# Patient Record
Sex: Female | Born: 1955 | Race: White | Hispanic: No | Marital: Married | Smoking: Never smoker
Health system: Southern US, Community
[De-identification: ages and names within clinical notes are randomized; demographics above are authoritative.]

## PROBLEM LIST (undated history)

## (undated) DIAGNOSIS — D219 Benign neoplasm of connective and other soft tissue, unspecified: Secondary | ICD-10-CM

## (undated) DIAGNOSIS — E079 Disorder of thyroid, unspecified: Secondary | ICD-10-CM

## (undated) HISTORY — DX: Benign neoplasm of connective and other soft tissue, unspecified: D21.9

## (undated) HISTORY — DX: Disorder of thyroid, unspecified: E07.9

---

## 1987-03-23 HISTORY — PX: BREAST SURGERY: SHX581

## 1987-03-23 HISTORY — PX: BREAST EXCISIONAL BIOPSY: SUR124

## 1997-03-22 HISTORY — PX: THYROIDECTOMY, PARTIAL: SHX18

## 1997-09-16 ENCOUNTER — Other Ambulatory Visit: Admission: RE | Admit: 1997-09-16 | Discharge: 1997-09-16 | Payer: Self-pay | Admitting: Obstetrics and Gynecology

## 1997-10-04 ENCOUNTER — Other Ambulatory Visit: Admission: RE | Admit: 1997-10-04 | Discharge: 1997-10-04 | Payer: Self-pay | Admitting: Obstetrics and Gynecology

## 1999-02-20 HISTORY — PX: APPENDECTOMY: SHX54

## 1999-02-25 ENCOUNTER — Encounter (INDEPENDENT_AMBULATORY_CARE_PROVIDER_SITE_OTHER): Payer: Self-pay | Admitting: Specialist

## 1999-02-25 ENCOUNTER — Inpatient Hospital Stay (HOSPITAL_COMMUNITY): Admission: EM | Admit: 1999-02-25 | Discharge: 1999-02-26 | Payer: Self-pay | Admitting: Emergency Medicine

## 1999-10-21 ENCOUNTER — Other Ambulatory Visit: Admission: RE | Admit: 1999-10-21 | Discharge: 1999-10-21 | Payer: Self-pay | Admitting: Obstetrics and Gynecology

## 1999-10-27 ENCOUNTER — Encounter: Payer: Self-pay | Admitting: Endocrinology

## 1999-10-27 ENCOUNTER — Encounter: Admission: RE | Admit: 1999-10-27 | Discharge: 1999-10-27 | Payer: Self-pay | Admitting: Endocrinology

## 2000-06-16 ENCOUNTER — Encounter: Payer: Self-pay | Admitting: Endocrinology

## 2000-06-16 ENCOUNTER — Encounter: Admission: RE | Admit: 2000-06-16 | Discharge: 2000-06-16 | Payer: Self-pay | Admitting: Endocrinology

## 2000-06-22 ENCOUNTER — Encounter: Payer: Self-pay | Admitting: Obstetrics and Gynecology

## 2000-06-22 ENCOUNTER — Encounter: Admission: RE | Admit: 2000-06-22 | Discharge: 2000-06-22 | Payer: Self-pay | Admitting: Obstetrics and Gynecology

## 2000-11-09 ENCOUNTER — Other Ambulatory Visit: Admission: RE | Admit: 2000-11-09 | Discharge: 2000-11-09 | Payer: Self-pay | Admitting: Obstetrics and Gynecology

## 2001-08-31 ENCOUNTER — Encounter: Payer: Self-pay | Admitting: Endocrinology

## 2001-08-31 ENCOUNTER — Encounter: Admission: RE | Admit: 2001-08-31 | Discharge: 2001-08-31 | Payer: Self-pay | Admitting: Endocrinology

## 2001-10-27 ENCOUNTER — Encounter: Payer: Self-pay | Admitting: Obstetrics and Gynecology

## 2001-10-27 ENCOUNTER — Encounter: Admission: RE | Admit: 2001-10-27 | Discharge: 2001-10-27 | Payer: Self-pay | Admitting: Obstetrics and Gynecology

## 2001-11-22 ENCOUNTER — Other Ambulatory Visit: Admission: RE | Admit: 2001-11-22 | Discharge: 2001-11-22 | Payer: Self-pay | Admitting: Obstetrics and Gynecology

## 2002-02-05 ENCOUNTER — Encounter: Payer: Self-pay | Admitting: Endocrinology

## 2002-02-05 ENCOUNTER — Encounter: Admission: RE | Admit: 2002-02-05 | Discharge: 2002-02-05 | Payer: Self-pay | Admitting: Endocrinology

## 2002-10-01 ENCOUNTER — Encounter: Payer: Self-pay | Admitting: Endocrinology

## 2002-10-01 ENCOUNTER — Encounter: Admission: RE | Admit: 2002-10-01 | Discharge: 2002-10-01 | Payer: Self-pay | Admitting: Endocrinology

## 2002-11-05 ENCOUNTER — Encounter: Admission: RE | Admit: 2002-11-05 | Discharge: 2002-11-05 | Payer: Self-pay | Admitting: Obstetrics and Gynecology

## 2002-11-05 ENCOUNTER — Encounter: Payer: Self-pay | Admitting: Obstetrics and Gynecology

## 2003-12-17 ENCOUNTER — Encounter: Admission: RE | Admit: 2003-12-17 | Discharge: 2003-12-17 | Payer: Self-pay | Admitting: Obstetrics and Gynecology

## 2003-12-17 ENCOUNTER — Encounter: Admission: RE | Admit: 2003-12-17 | Discharge: 2003-12-17 | Payer: Self-pay | Admitting: Endocrinology

## 2004-03-18 ENCOUNTER — Other Ambulatory Visit: Admission: RE | Admit: 2004-03-18 | Discharge: 2004-03-18 | Payer: Self-pay | Admitting: Obstetrics and Gynecology

## 2005-03-24 ENCOUNTER — Encounter: Admission: RE | Admit: 2005-03-24 | Discharge: 2005-03-24 | Payer: Self-pay | Admitting: Endocrinology

## 2005-07-01 ENCOUNTER — Encounter: Admission: RE | Admit: 2005-07-01 | Discharge: 2005-07-01 | Payer: Self-pay | Admitting: Obstetrics and Gynecology

## 2006-04-13 ENCOUNTER — Encounter: Admission: RE | Admit: 2006-04-13 | Discharge: 2006-04-13 | Payer: Self-pay | Admitting: Endocrinology

## 2006-04-21 ENCOUNTER — Other Ambulatory Visit: Admission: RE | Admit: 2006-04-21 | Discharge: 2006-04-21 | Payer: Self-pay | Admitting: Obstetrics and Gynecology

## 2007-03-30 ENCOUNTER — Encounter: Admission: RE | Admit: 2007-03-30 | Discharge: 2007-03-30 | Payer: Self-pay | Admitting: Endocrinology

## 2007-04-04 ENCOUNTER — Encounter: Admission: RE | Admit: 2007-04-04 | Discharge: 2007-04-04 | Payer: Self-pay | Admitting: Obstetrics and Gynecology

## 2008-04-05 ENCOUNTER — Encounter: Admission: RE | Admit: 2008-04-05 | Discharge: 2008-04-05 | Payer: Self-pay | Admitting: Endocrinology

## 2008-10-22 ENCOUNTER — Encounter: Admission: RE | Admit: 2008-10-22 | Discharge: 2008-10-22 | Payer: Self-pay | Admitting: Obstetrics and Gynecology

## 2009-04-03 ENCOUNTER — Encounter: Admission: RE | Admit: 2009-04-03 | Discharge: 2009-04-03 | Payer: Self-pay | Admitting: Endocrinology

## 2009-10-08 HISTORY — PX: ENDOMETRIAL BIOPSY: SHX622

## 2009-10-08 LAB — HM PAP SMEAR

## 2010-04-07 ENCOUNTER — Encounter
Admission: RE | Admit: 2010-04-07 | Discharge: 2010-04-07 | Payer: Self-pay | Source: Home / Self Care | Attending: Endocrinology | Admitting: Endocrinology

## 2011-04-05 ENCOUNTER — Other Ambulatory Visit: Payer: Self-pay | Admitting: Endocrinology

## 2011-04-05 DIAGNOSIS — E042 Nontoxic multinodular goiter: Secondary | ICD-10-CM

## 2011-04-07 ENCOUNTER — Ambulatory Visit
Admission: RE | Admit: 2011-04-07 | Discharge: 2011-04-07 | Disposition: A | Discharge status: Home / Self Care | Payer: PRIVATE HEALTH INSURANCE | Source: Ambulatory Visit | Attending: Endocrinology | Admitting: Endocrinology

## 2011-04-07 DIAGNOSIS — E042 Nontoxic multinodular goiter: Secondary | ICD-10-CM

## 2012-01-10 ENCOUNTER — Other Ambulatory Visit: Payer: Self-pay | Admitting: Endocrinology

## 2012-01-10 ENCOUNTER — Other Ambulatory Visit: Payer: Self-pay | Admitting: Obstetrics and Gynecology

## 2012-01-10 DIAGNOSIS — Z1231 Encounter for screening mammogram for malignant neoplasm of breast: Secondary | ICD-10-CM

## 2012-01-10 DIAGNOSIS — E042 Nontoxic multinodular goiter: Secondary | ICD-10-CM

## 2012-01-11 ENCOUNTER — Ambulatory Visit
Admission: RE | Admit: 2012-01-11 | Discharge: 2012-01-11 | Disposition: A | Discharge status: Home / Self Care | Payer: PRIVATE HEALTH INSURANCE | Source: Ambulatory Visit | Attending: Endocrinology | Admitting: Endocrinology

## 2012-01-11 ENCOUNTER — Ambulatory Visit
Admission: RE | Admit: 2012-01-11 | Discharge: 2012-01-11 | Disposition: A | Discharge status: Home / Self Care | Payer: PRIVATE HEALTH INSURANCE | Source: Ambulatory Visit | Attending: Obstetrics and Gynecology | Admitting: Obstetrics and Gynecology

## 2012-01-11 DIAGNOSIS — E042 Nontoxic multinodular goiter: Secondary | ICD-10-CM

## 2012-01-11 DIAGNOSIS — Z1231 Encounter for screening mammogram for malignant neoplasm of breast: Secondary | ICD-10-CM

## 2012-08-24 ENCOUNTER — Other Ambulatory Visit: Payer: Self-pay | Admitting: Endocrinology

## 2012-08-24 DIAGNOSIS — E042 Nontoxic multinodular goiter: Secondary | ICD-10-CM

## 2012-09-19 ENCOUNTER — Ambulatory Visit
Admission: RE | Admit: 2012-09-19 | Discharge: 2012-09-19 | Disposition: A | Discharge status: Home / Self Care | Payer: PRIVATE HEALTH INSURANCE | Source: Ambulatory Visit | Attending: Endocrinology | Admitting: Endocrinology

## 2012-09-19 DIAGNOSIS — E042 Nontoxic multinodular goiter: Secondary | ICD-10-CM

## 2012-10-10 ENCOUNTER — Other Ambulatory Visit: Payer: Self-pay | Admitting: Gastroenterology

## 2012-10-10 DIAGNOSIS — Z1211 Encounter for screening for malignant neoplasm of colon: Secondary | ICD-10-CM

## 2012-12-25 ENCOUNTER — Ambulatory Visit
Admission: RE | Admit: 2012-12-25 | Discharge: 2012-12-25 | Disposition: A | Discharge status: Home / Self Care | Payer: PRIVATE HEALTH INSURANCE | Source: Ambulatory Visit | Attending: Gastroenterology | Admitting: Gastroenterology

## 2012-12-25 DIAGNOSIS — Z1211 Encounter for screening for malignant neoplasm of colon: Secondary | ICD-10-CM

## 2013-03-06 ENCOUNTER — Other Ambulatory Visit: Payer: Self-pay | Admitting: Endocrinology

## 2013-03-06 DIAGNOSIS — Z1231 Encounter for screening mammogram for malignant neoplasm of breast: Secondary | ICD-10-CM

## 2013-03-12 ENCOUNTER — Other Ambulatory Visit: Payer: Self-pay | Admitting: Endocrinology

## 2013-03-12 DIAGNOSIS — E042 Nontoxic multinodular goiter: Secondary | ICD-10-CM

## 2013-03-20 ENCOUNTER — Ambulatory Visit
Admission: RE | Admit: 2013-03-20 | Discharge: 2013-03-20 | Disposition: A | Discharge status: Home / Self Care | Payer: PRIVATE HEALTH INSURANCE | Source: Ambulatory Visit | Attending: Endocrinology | Admitting: Endocrinology

## 2013-03-20 DIAGNOSIS — E042 Nontoxic multinodular goiter: Secondary | ICD-10-CM

## 2013-03-20 DIAGNOSIS — Z1231 Encounter for screening mammogram for malignant neoplasm of breast: Secondary | ICD-10-CM

## 2013-03-20 LAB — HM MAMMOGRAPHY

## 2013-03-28 ENCOUNTER — Ambulatory Visit (INDEPENDENT_AMBULATORY_CARE_PROVIDER_SITE_OTHER): Payer: PRIVATE HEALTH INSURANCE | Admitting: Obstetrics and Gynecology

## 2013-03-28 ENCOUNTER — Encounter: Payer: Self-pay | Admitting: Obstetrics and Gynecology

## 2013-03-28 ENCOUNTER — Ambulatory Visit: Payer: Self-pay | Admitting: Obstetrics and Gynecology

## 2013-03-28 VITALS — BP 122/80 | HR 66 | Resp 16 | Ht 64.5 in | Wt 122.0 lb

## 2013-03-28 DIAGNOSIS — Z0000 Encounter for general adult medical examination without abnormal findings: Secondary | ICD-10-CM

## 2013-03-28 DIAGNOSIS — Z01419 Encounter for gynecological examination (general) (routine) without abnormal findings: Secondary | ICD-10-CM

## 2013-03-28 NOTE — Patient Instructions (Signed)

## 2013-03-28 NOTE — Progress Notes (Signed)
Patient ID: Becky Anderson, female   DOB: 1955/07/26, 58 y.o.   MRN: [id number] GYNECOLOGY VISIT  PCP:   Reynold Bowen, MD  Referring provider:   HPI: 58 y.o.   Married  Caucasian  female   G2P2002 with Patient's last menstrual period was 08/21/2010.   here for   AEX. History of gestational diabetes.  Does periodic glucose testing.  Followed by Dr. Forde Dandy for thyroid nodules.  Has periodic thyroid testing and ultrasound. Will do blood work with Dr. Forde Dandy.   Hgb:    PCP.  Lab draw unsuccessful today.  Will do with PCP.  Urine:  Neg  GYNECOLOGIC HISTORY: Patient's last menstrual period was 08/21/2010. Sexually active:  yes Partner preference: female Contraception:   postmenopausal Menopausal hormone therapy: none DES exposure:   no Blood transfusions:  no  Sexually transmitted diseases:   no GYN Procedures:  Endometrial biopsy 2012 - benign endocervical polyp Mammogram:   03-20-13 wnl:[facility]              Pap:   10-08-09 wnl History of abnormal pap smear:  no   OB History   Grav Para Term Preterm Abortions TAB SAB Ect Mult Living   2 2 2       2        LIFESTYLE: Exercise:   Some walking, some stretching          Tobacco:     no Alcohol:       no Drug use:    no  OTHER HEALTH MAINTENANCE: Tetanus/TDap:     03/2006 Gardisil:                n/a Influenza:               never Zostavax:               n/a  Bone density:        none Colonoscopy:        2014 wnl with Dr. Laurence Spates.  Did a virtual colonoscopy.   Next colonoscopy due 2019.  Cholesterol check:    PCP--wnl   Family History  Problem Relation Age of Onset  . Hypertension Mother   . Cancer Mother 64    Rectal cancer  . Stroke Mother   . Atrial fibrillation Father   . Hypertension Father   . Breast cancer Maternal Aunt   . Stroke Paternal Grandfather     There are no active problems to display for this patient.  Past Medical History  Diagnosis Date  . Fibroid   . Thyroid disease      thyroidectomy-partial    Past Surgical History  Procedure Laterality Date  . Appendectomy  02/1999  . Breast surgery  1989    rt. breast bx--benign  . Thyroidectomy, partial  1999  . Endometrial biopsy  10-08-2009    -benign proliferative endometrium    ALLERGIES: Ceclor and Sulfa antibiotics  Current Outpatient Prescriptions  Medication Sig Dispense Refill  . levothyroxine (SYNTHROID, LEVOTHROID) 75 MCG tablet Take 75 mcg by mouth daily before breakfast.       No current facility-administered medications for this visit.     ROS:  Pertinent items are noted in HPI.  SOCIAL HISTORY:  Engineer, maintenance (IT).  Two boys - 5 and 46 years old. Husband is radiologist.  Aunt died from [place].  Mother wiht rectal cancer.   Father with ruptured appendix.    PHYSICAL EXAMINATION:    BP 122/80  Pulse  66  Resp 16  Ht 5' 4.5" (1.638 m)  Wt 122 lb (55.339 kg)  BMI 20.63 kg/m2  LMP 08/21/2010   Wt Readings from Last 3 Encounters:  03/28/13 122 lb (55.339 kg)     Ht Readings from Last 3 Encounters:  03/28/13 5' 4.5" (1.638 m)    General appearance: alert, cooperative and appears stated age Head: Normocephalic, without obvious abnormality, atraumatic Neck: no adenopathy, supple, symmetrical, trachea midline and thyroid not enlarged, symmetric, no tenderness/mass/nodules Lungs: clear to auscultation bilaterally Breasts: Inspection negative, No nipple retraction or dimpling, No nipple discharge or bleeding, No axillary or supraclavicular adenopathy, Normal to palpation without dominant masses Heart: regular rate and rhythm Abdomen: soft, non-tender; no masses,  no organomegaly Extremities: extremities normal, atraumatic, no cyanosis or edema Skin: Skin color, texture, turgor normal. No rashes or lesions Lymph nodes: Cervical, supraclavicular, and axillary nodes normal. No abnormal inguinal nodes palpated Neurologic: Grossly normal  Pelvic: External genitalia:  no lesions              Urethra:   normal appearing urethra with no masses, tenderness or lesions              Bartholins and Skenes: normal                 Vagina: normal appearing vagina with normal color and discharge, no lesions              Cervix: normal appearance              Pap and high risk HPV testing done: yes.            Bimanual Exam:  Uterus:  uterus is normal size, shape, consistency and nontender                                      Adnexa: normal adnexa in size, nontender and no masses                                      Rectovaginal: Confirms                                      Anus:  normal sphincter tone, no lesions  ASSESSMENT  Normal gynecologic exam. History of thyroid nodules. History of gestational diabetes.  PLAN  Mammogram yearly. Pap smear and high risk HPV testing performed. Will do labs with PCP.  Return annually or prn   An After Visit Summary was printed and given to the patient.

## 2013-03-30 LAB — IPS PAP TEST WITH HPV

## 2013-11-15 ENCOUNTER — Encounter: Payer: Self-pay | Admitting: Obstetrics and Gynecology

## 2014-01-21 ENCOUNTER — Encounter: Payer: Self-pay | Admitting: Obstetrics and Gynecology

## 2014-04-01 ENCOUNTER — Ambulatory Visit: Payer: PRIVATE HEALTH INSURANCE | Admitting: Obstetrics and Gynecology

## 2014-04-03 ENCOUNTER — Ambulatory Visit (INDEPENDENT_AMBULATORY_CARE_PROVIDER_SITE_OTHER): Payer: PRIVATE HEALTH INSURANCE | Admitting: Obstetrics and Gynecology

## 2014-04-03 ENCOUNTER — Encounter: Payer: Self-pay | Admitting: Obstetrics and Gynecology

## 2014-04-03 VITALS — BP 100/70 | HR 84 | Resp 14 | Ht 64.5 in | Wt 122.6 lb

## 2014-04-03 DIAGNOSIS — R319 Hematuria, unspecified: Secondary | ICD-10-CM

## 2014-04-03 DIAGNOSIS — Z01419 Encounter for gynecological examination (general) (routine) without abnormal findings: Secondary | ICD-10-CM

## 2014-04-03 DIAGNOSIS — Z0000 Encounter for general adult medical examination without abnormal findings: Secondary | ICD-10-CM

## 2014-04-03 LAB — POCT URINALYSIS DIPSTICK
BILIRUBIN UA: NEGATIVE
GLUCOSE UA: NEGATIVE
KETONES UA: NEGATIVE
LEUKOCYTES UA: NEGATIVE
Nitrite, UA: NEGATIVE
PH UA: 5
Protein, UA: NEGATIVE
Urobilinogen, UA: NEGATIVE

## 2014-04-03 NOTE — Progress Notes (Signed)
Patient ID: Becky Anderson, female   DOB: 10-03-1955, 59 y.o.   MRN: [id number] 59 y.o. [id number] MarriedCaucasianF here for annual exam.   PCP:  Reynold Bowen, MD  Trying to help her parents.  They live independently.  Mother treated for colon cancer.  Father with ruptured appendix and recurrent infections.   Sees Dr. Forde Dandy for follow up thyroid nodules and partial thyroidectomy.  History of gestational diabetes.   Friend and neighbor passed from liver cancer.   Patient's last menstrual period was 08/21/2010.          Sexually active: Yes.  female partner  The current method of family planning is status post menopause.  Exercising: Yes.    walking. Smoker:  no  Health Maintenance: Pap:  03-28-13 wnl:neg HR HPV History of abnormal Pap:  no MMG:  03-20-13 extremely dense/wnl:[facility] Colonoscopy:  12/2012 normal with Dr. Laurence Spates.  Next colonoscopy due 12/2017. BMD:    --- TDaP:  03/2006. Screening Labs:   Hb today: PCP, Urine today: trace RBC's - some mild dysuria.    reports that she has never smoked. She does not have any smokeless tobacco history on file. She reports that she does not drink alcohol or use illicit drugs.  Past Medical History  Diagnosis Date  . Fibroid   . Thyroid disease     thyroidectomy-partial    Past Surgical History  Procedure Laterality Date  . Appendectomy  02/1999  . Breast surgery  1989    rt. breast bx--benign  . Thyroidectomy, partial  1999  . Endometrial biopsy  10-08-2009    -benign proliferative endometrium    Current Outpatient Prescriptions  Medication Sig Dispense Refill  . levothyroxine (SYNTHROID, LEVOTHROID) 75 MCG tablet Take 75 mcg by mouth daily before breakfast.     No current facility-administered medications for this visit.    Family History  Problem Relation Age of Onset  . Hypertension Mother   . Cancer Mother 33    Rectal cancer  . Stroke Mother   . Atrial fibrillation Father   . Hypertension Father   .  Breast cancer Maternal Aunt   . Stroke Paternal Grandfather     ROS:  Pertinent items are noted in HPI.  Otherwise, a comprehensive ROS was negative.  Exam:   BP 100/70 mmHg  Pulse 84  Resp 14  Ht 5' 4.5" (1.638 m)  Wt 122 lb 9.6 oz (55.611 kg)  BMI 20.73 kg/m2  LMP 08/21/2010     Height: 5' 4.5" (163.8 cm)  Ht Readings from Last 3 Encounters:  04/03/14 5' 4.5" (1.638 m)  03/28/13 5' 4.5" (1.638 m)    General appearance: alert, cooperative and appears stated age Head: Normocephalic, without obvious abnormality, atraumatic Neck: no adenopathy, supple, symmetrical, trachea midline and thyroid normal to inspection and palpation Lungs: clear to auscultation bilaterally Breasts: normal appearance, no masses or tenderness, Inspection negative, No nipple retraction or dimpling, No nipple discharge or bleeding, No axillary or supraclavicular adenopathy Heart: regular rate and rhythm Abdomen: soft, non-tender; bowel sounds normal; no masses,  no organomegaly Extremities: extremities normal, atraumatic, no cyanosis or edema Skin: Skin color, texture, turgor normal. No rashes or lesions Lymph nodes: Cervical, supraclavicular, and axillary nodes normal. No abnormal inguinal nodes palpated Neurologic: Grossly normal   Pelvic: External genitalia:  no lesions              Urethra:  normal appearing urethra with no masses, tenderness or lesions  Bartholins and Skenes: normal                 Vagina: normal appearing vagina with normal color and discharge, no lesions. Minimal cystocele.               Cervix: no lesions              Pap taken: No. Bimanual Exam:  Uterus:  normal size, contour, position, consistency, mobility, non-tender              Adnexa: normal adnexa and no mass, fullness, tenderness               Rectovaginal: Confirms               Anus:  normal sphincter tone, no lesions  Chaperone was present for exam.  A:  Well Woman with normal exam Status post  partial thyroidectomy.  Hx of gestational diabetes.  Minimal cystocele. Microscopic hematuria.   P:   Mammogram due.  Patient will schedule 3D pap smear not indicated. Labs with Dr. Forde Dandy.  Encouraged increased physical activity.  Return for increased pelvic organ prolapse or urinary incontinence.  return annually or prn

## 2014-04-03 NOTE — Addendum Note (Signed)
Addended by: Charmayne Sheer on: 04/03/2014 01:59 PM   Modules accepted: Orders

## 2014-04-03 NOTE — Patient Instructions (Signed)

## 2014-04-04 LAB — URINALYSIS, MICROSCOPIC ONLY
Bacteria, UA: NONE SEEN
CASTS: NONE SEEN
CRYSTALS: NONE SEEN
SQUAMOUS EPITHELIAL / LPF: NONE SEEN

## 2014-04-04 LAB — URINE CULTURE
Colony Count: NO GROWTH
Organism ID, Bacteria: NO GROWTH

## 2014-04-30 ENCOUNTER — Other Ambulatory Visit: Payer: Self-pay | Admitting: Endocrinology

## 2014-04-30 DIAGNOSIS — Z8639 Personal history of other endocrine, nutritional and metabolic disease: Secondary | ICD-10-CM

## 2014-05-03 ENCOUNTER — Ambulatory Visit
Admission: RE | Admit: 2014-05-03 | Discharge: 2014-05-03 | Disposition: A | Discharge status: Home / Self Care | Payer: PRIVATE HEALTH INSURANCE | Source: Ambulatory Visit | Attending: Endocrinology | Admitting: Endocrinology

## 2014-05-03 DIAGNOSIS — Z8639 Personal history of other endocrine, nutritional and metabolic disease: Secondary | ICD-10-CM

## 2015-04-11 ENCOUNTER — Encounter: Payer: Self-pay | Admitting: Obstetrics and Gynecology

## 2015-04-11 ENCOUNTER — Ambulatory Visit (INDEPENDENT_AMBULATORY_CARE_PROVIDER_SITE_OTHER): Payer: PRIVATE HEALTH INSURANCE | Admitting: Obstetrics and Gynecology

## 2015-04-11 VITALS — BP 140/76 | HR 76 | Resp 16 | Ht 64.5 in | Wt 125.4 lb

## 2015-04-11 DIAGNOSIS — Z01419 Encounter for gynecological examination (general) (routine) without abnormal findings: Secondary | ICD-10-CM | POA: Diagnosis not present

## 2015-04-11 DIAGNOSIS — N812 Incomplete uterovaginal prolapse: Secondary | ICD-10-CM

## 2015-04-11 DIAGNOSIS — Z0000 Encounter for general adult medical examination without abnormal findings: Secondary | ICD-10-CM

## 2015-04-11 DIAGNOSIS — Z1211 Encounter for screening for malignant neoplasm of colon: Secondary | ICD-10-CM | POA: Diagnosis not present

## 2015-04-11 LAB — POCT URINALYSIS DIPSTICK
Bilirubin, UA: NEGATIVE
Glucose, UA: NEGATIVE
Ketones, UA: NEGATIVE
Leukocytes, UA: NEGATIVE
NITRITE UA: NEGATIVE
PROTEIN UA: NEGATIVE
RBC UA: NEGATIVE
UROBILINOGEN UA: NEGATIVE
pH, UA: 5

## 2015-04-11 NOTE — Patient Instructions (Signed)

## 2015-04-11 NOTE — Progress Notes (Signed)
Patient ID: Becky Anderson, female   DOB: Jul 10, 1955, 60 y.o.   MRN: [id number] 60 y.o. G34P2002 Married Caucasian female here for annual exam.    No real change in her prolapse.  Voiding well and using fiber to help with bowel movements.  Younger son started college.  Is a Engineer, maintenance (IT).  Psychologist, occupational for [employer] for [employer]. Husband is Dr. Kalman Shan.  PCP:  Reynold Bowen, MD   Patient's last menstrual period was 08/21/2010.          Sexually active: Yes.   female The current method of family planning is post menopausal status.    Exercising: No.   Smoker:  no  Health Maintenance: Pap:  03-28-13 Neg:Neg HR HPV History of abnormal Pap:  no MMG:   03-23-13 Density Cat.D/Neg/BiRads1:[facility] Colonoscopy:  12/2012 normal with Dr. Alferd Apa [facility]);next due 12/2017 due to family hx colon cancer in mom. BMD:   n/a  Result  n/a TDaP:  Jan. 2008 Screening Labs:  Hb today: PCP, Urine today: Neg   reports that she has never smoked. She does not have any smokeless tobacco history on file. She reports that she does not drink alcohol or use illicit drugs.  Past Medical History  Diagnosis Date  . Fibroid   . Thyroid disease     thyroidectomy-partial    Past Surgical History  Procedure Laterality Date  . Appendectomy  02/1999  . Breast surgery  1989    rt. breast bx--benign  . Thyroidectomy, partial  1999  . Endometrial biopsy  10-08-2009    -benign proliferative endometrium    Current Outpatient Prescriptions  Medication Sig Dispense Refill  . levothyroxine (SYNTHROID, LEVOTHROID) 75 MCG tablet Take 75 mcg by mouth daily before breakfast. Takes 6 days a week     No current facility-administered medications for this visit.    Family History  Problem Relation Age of Onset  . Hypertension Mother   . Cancer Mother 53    Rectal cancer  . Stroke Mother   . Atrial fibrillation Father   . Hypertension Father   . Other Father     hx ruptured appendix w/sepsis  . Breast cancer Maternal Aunt    . Stroke Paternal Grandfather     ROS:  Pertinent items are noted in HPI.  Otherwise, a comprehensive ROS was negative.  Exam:   BP 140/76 mmHg  Pulse 76  Resp 16  Ht 5' 4.5" (1.638 m)  Wt 125 lb 6.4 oz (56.881 kg)  BMI 21.20 kg/m2  LMP 08/21/2010    General appearance: alert, cooperative and appears stated age Head: Normocephalic, without obvious abnormality, atraumatic Neck: no adenopathy, supple, symmetrical, trachea midline and thyroid normal to inspection and palpation Lungs: clear to auscultation bilaterally Breasts: normal appearance, no masses or tenderness, Inspection negative, No nipple retraction or dimpling, No nipple discharge or bleeding, No axillary or supraclavicular adenopathy Heart: regular rate and rhythm Abdomen: soft, non-tender; bowel sounds normal; no masses,  no organomegaly Extremities: extremities normal, atraumatic, no cyanosis or edema Skin: Skin color, texture, turgor normal. No rashes or lesions Lymph nodes: Cervical, supraclavicular, and axillary nodes normal. No abnormal inguinal nodes palpated Neurologic: Grossly normal  Pelvic: External genitalia:  no lesions              Urethra:  normal appearing urethra with no masses, tenderness or lesions              Bartholins and Skenes: normal  Vagina: normal appearing vagina with normal color and discharge, no lesions              Cervix: no lesions              Pap taken: No. Bimanual Exam:  Uterus:  normal size, contour, position, consistency, mobility, non-tender.  First degree uterine prolapse.  Minimal rectocele.               Adnexa: normal adnexa and no mass, fullness, tenderness              Rectovaginal: Yes.  .  Confirms.              Anus:  normal sphincter tone, no lesions  Chaperone was present for exam.  Assessment:   Well woman visit with normal exam. Incomplete uterovaginal prolapse. FH of rectal cancer.  Status post partial thyroidectomy.  Hx of gestational  diabetes.    Plan: Yearly mammogram recommended after age 22.  Discussed 3D Recommended self breast exam.  Pap and HR HPV as above. Information shared on Calcium, Vitamin D, regular exercise program including cardiovascular and weight bearing exercise. Labs performed.  IFOB given to patient.   Routine labs with Dr. Forde Dandy.  Refills given on medications.  No..    Discussion of pelvic organ prolapse and surgical care versus pessary.  ACOG handouts on these items.  Follow up annually and prn.     After visit summary provided.

## 2015-04-21 ENCOUNTER — Other Ambulatory Visit: Payer: Self-pay | Admitting: Endocrinology

## 2015-04-21 DIAGNOSIS — E041 Nontoxic single thyroid nodule: Secondary | ICD-10-CM

## 2015-04-23 ENCOUNTER — Telehealth: Payer: Self-pay

## 2015-04-23 ENCOUNTER — Ambulatory Visit
Admission: RE | Admit: 2015-04-23 | Discharge: 2015-04-23 | Disposition: A | Discharge status: Home / Self Care | Payer: PRIVATE HEALTH INSURANCE | Source: Ambulatory Visit | Attending: Endocrinology | Admitting: Endocrinology

## 2015-04-23 DIAGNOSIS — E041 Nontoxic single thyroid nodule: Secondary | ICD-10-CM

## 2015-04-23 NOTE — Telephone Encounter (Signed)
-----   Message from Nunzio Cobbs, MD sent at 04/22/2015  5:23 PM EST ----- Please contact patient to remind her to return her IFOB.   She came up in my reminder box in EPIC.   Thank you so much,   Brook  ----- Message -----    From: SYSTEM    Sent: 04/16/2015  12:05 AM      To: Nunzio Cobbs, MD

## 2015-04-23 NOTE — Telephone Encounter (Signed)
Spoke with patient. Advised of message as seen below from [place]. The patient is agreeable and verbalizes understanding. She will complete and return her IFOB testing to the office.  Routing to provider for final review. Patient agreeable to disposition. Will close encounter.

## 2015-04-23 NOTE — Telephone Encounter (Signed)
Left message to call Zarius Furr at [phone]. 

## 2015-04-24 ENCOUNTER — Other Ambulatory Visit: Payer: PRIVATE HEALTH INSURANCE

## 2015-04-25 ENCOUNTER — Other Ambulatory Visit: Payer: Self-pay | Admitting: Endocrinology

## 2015-04-25 DIAGNOSIS — E041 Nontoxic single thyroid nodule: Secondary | ICD-10-CM

## 2015-05-07 ENCOUNTER — Other Ambulatory Visit: Payer: PRIVATE HEALTH INSURANCE

## 2015-05-08 ENCOUNTER — Ambulatory Visit
Admission: RE | Admit: 2015-05-08 | Discharge: 2015-05-08 | Disposition: A | Discharge status: Home / Self Care | Payer: PRIVATE HEALTH INSURANCE | Source: Ambulatory Visit | Attending: Endocrinology | Admitting: Endocrinology

## 2015-05-08 ENCOUNTER — Other Ambulatory Visit (HOSPITAL_COMMUNITY)
Admission: RE | Admit: 2015-05-08 | Discharge: 2015-05-08 | Disposition: A | Discharge status: Home / Self Care | Payer: PRIVATE HEALTH INSURANCE | Source: Ambulatory Visit | Attending: Interventional Radiology | Admitting: Interventional Radiology

## 2015-05-08 DIAGNOSIS — E041 Nontoxic single thyroid nodule: Secondary | ICD-10-CM

## 2015-05-22 ENCOUNTER — Telehealth: Payer: Self-pay

## 2015-05-22 NOTE — Telephone Encounter (Signed)
-----   Message from Nunzio Cobbs, MD sent at 05/21/2015  7:50 PM EST ----- Regarding: IFOB Please contact patient back about the IFOB.   If she chooses to not do this, we can cancel it in our system.   Thanks,   Brook   ----- Message -----    From: SYSTEM    Sent: 04/16/2015  12:05 AM      To: Nunzio Cobbs, MD

## 2015-05-22 NOTE — Telephone Encounter (Signed)
Spoke with patient. Patient states that she has had to have some other medical procedure done recently, but she has not forgotten about the IFOB. States she will perform this soon and send it in to the office.  Routing to provider for final review. Patient agreeable to disposition. Will close encounter.

## 2016-04-14 IMAGING — US US SOFT TISSUE HEAD/NECK
1 series · 14 of 25 positions shown · non-contrast
Comparison: 05/03/2014 and previous

CLINICAL DATA: Previous left hemithyroidectomy.  Nodules.

EXAM:
THYROID ULTRASOUND
TECHNIQUE: Ultrasound examination of the thyroid gland and adjacent soft
tissues was performed.

[Series 1: us soft tissue head/neck · 0.08mm/px · 14 of 44 slices shown]
[im 1/44]
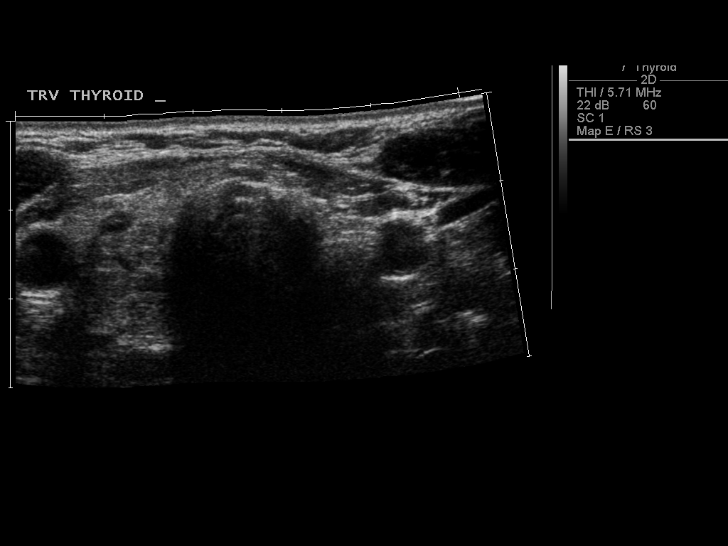
[im 4/44]
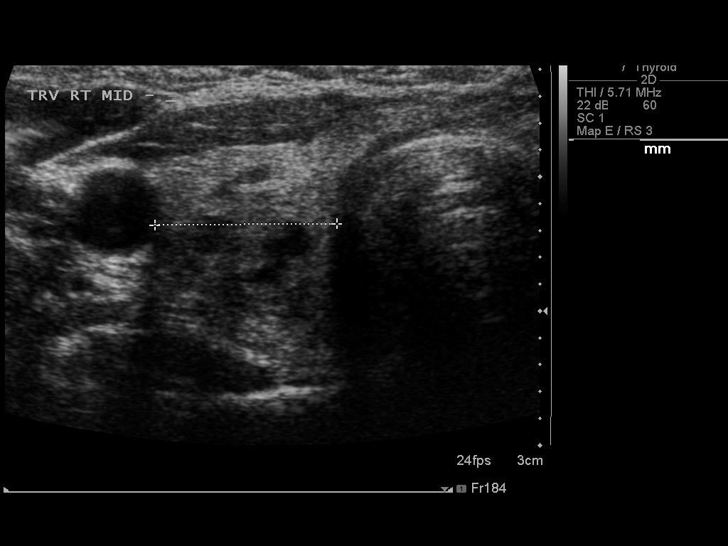
[im 8/44]
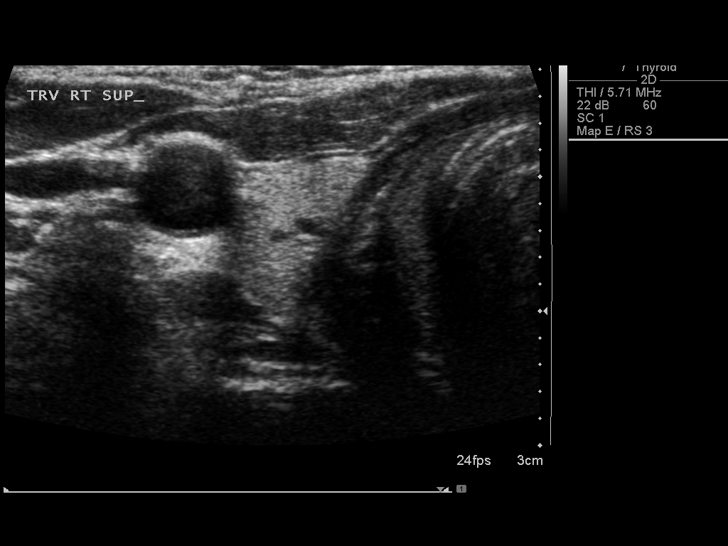
[im 11/44]
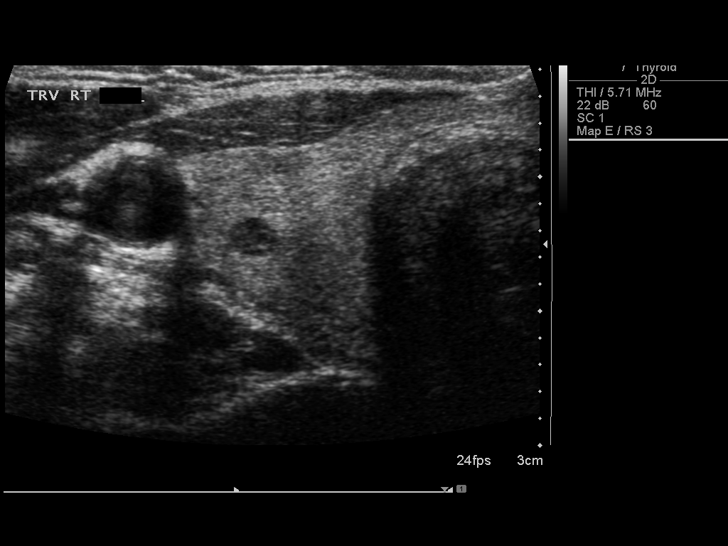
[im 15/44]
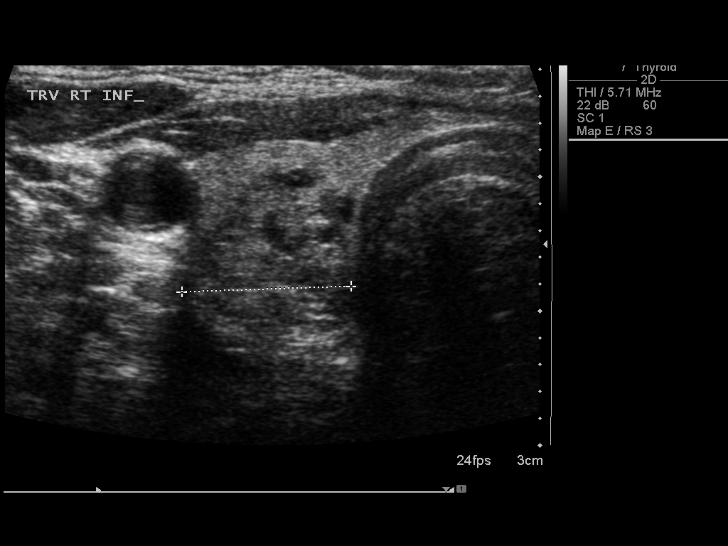
[im 17/44]
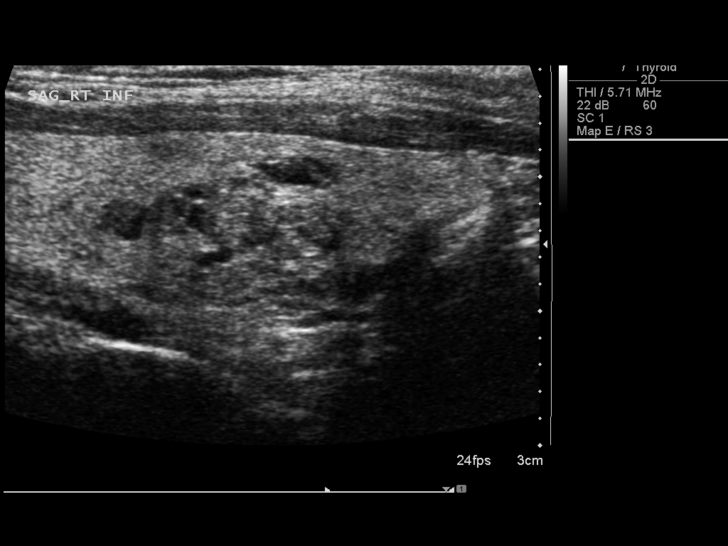
[im 20/44]
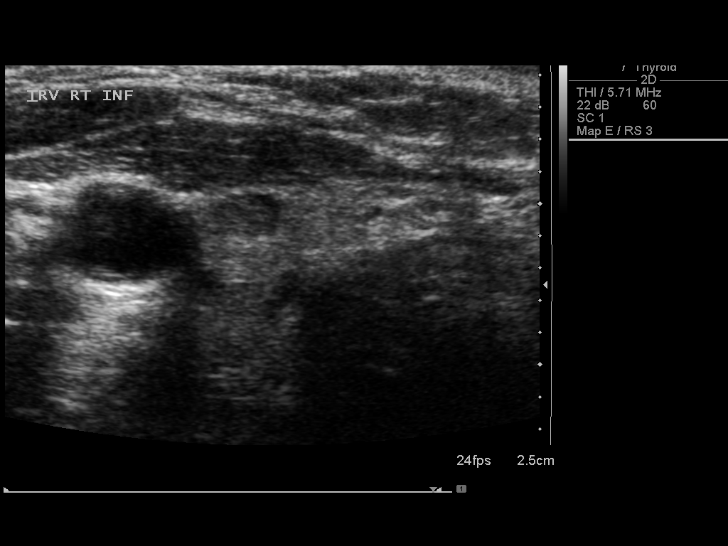
[im 24/44]
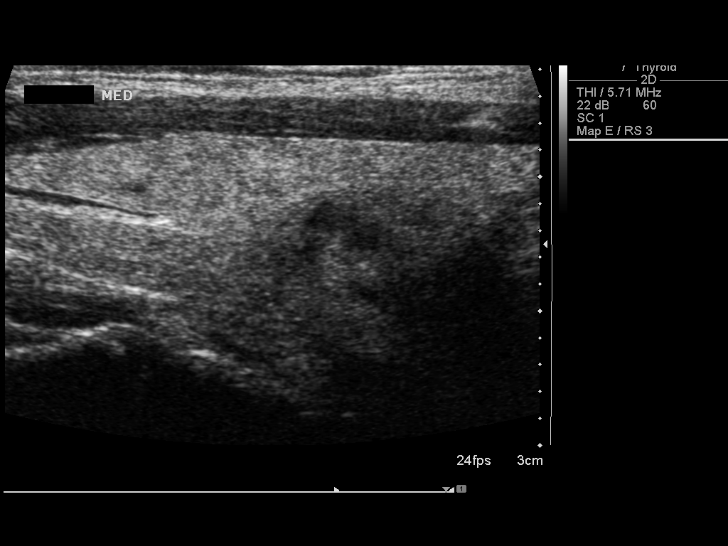
[im 27/44]
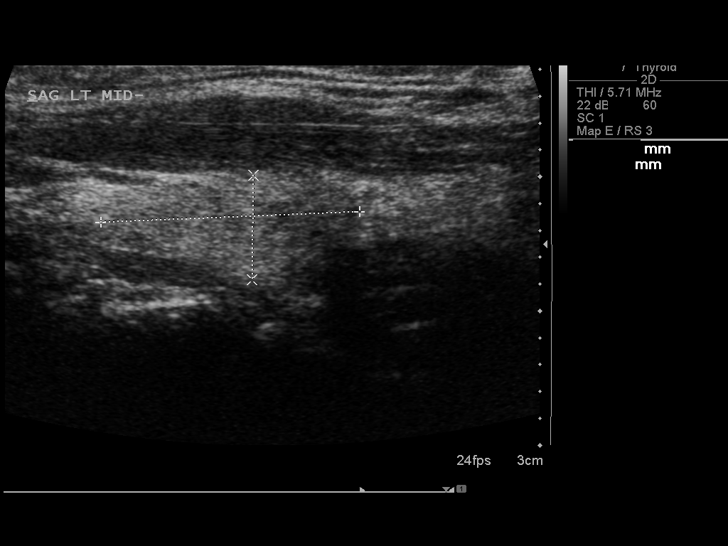
[im 29/44]
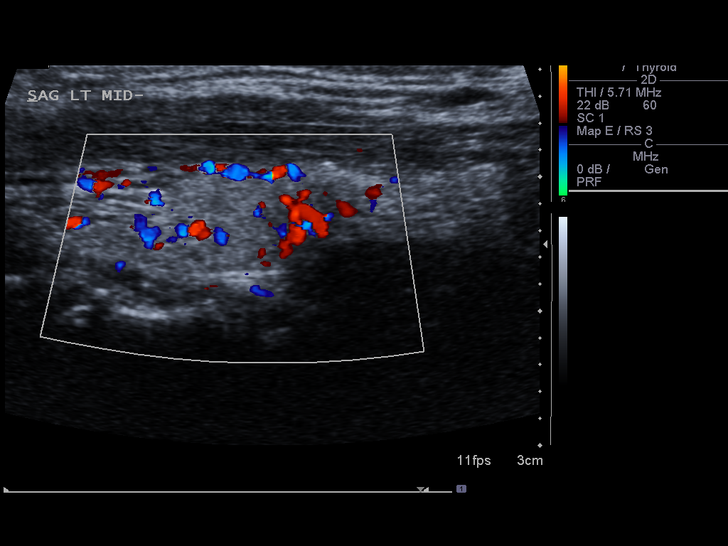
[im 33/44]
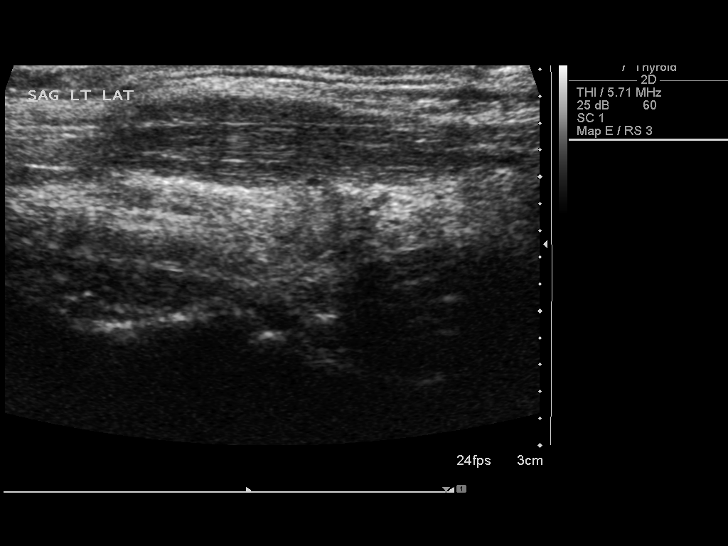
[im 36/44]
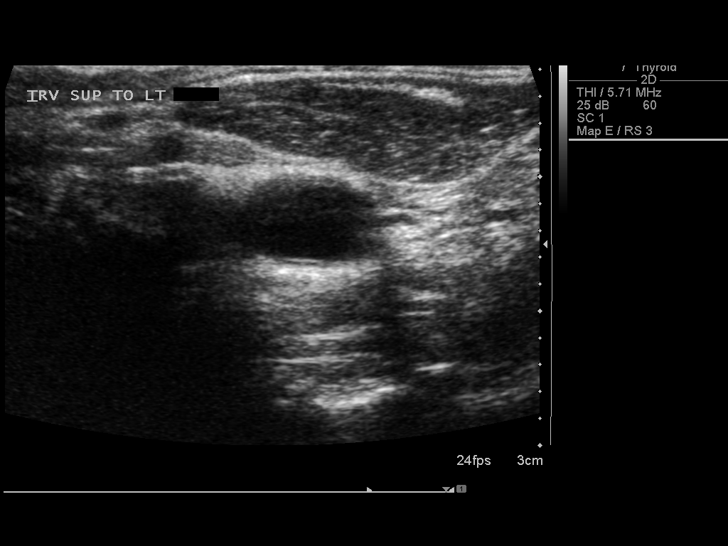
[im 40/44]
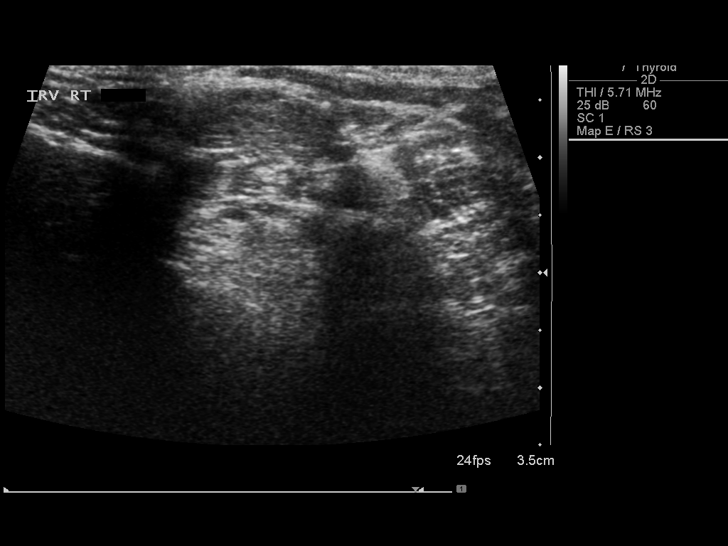
[im 44/44]
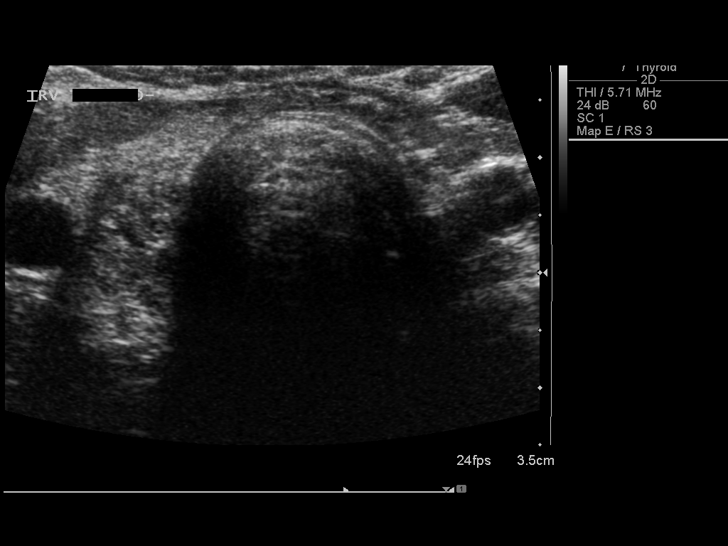

[14 of 25 positions shown; findings below may reference images not displayed]

FINDINGS: Right thyroid lobe

Measurements: 57 x 17 x 14 mm. 4 mm hypoechoic nodule, mid lobe. 3
mm hypoechoic nodule more superficially. Complex 13 x 20 x 11 mm
Adjacent 5 mm hypoechoic nodule more inferiorly.

Left thyroid lobe

Measurements: Residual tissue in the thyroidectomy bed 9 x 19 x 8 mm
(previously 17 x 7 x 7). No nodules visualized.

Isthmus

Thickness: 2.7 mm.  No nodules visualized.

Lymphadenopathy

None visualized.
IMPRESSION: 1. Interval progression in size of dominant right nodule, which
meets consensus criteria for biopsy. Ultrasound-guided fine needle
aspiration should be considered, as per the consensus statement:
Management of Thyroid Nodules Detected at US: Society of
Radiologists in Ultrasound Consensus Conference Statement. Radiology
2. Small left thyroid lobe remnant without focal lesion.

## 2016-04-29 IMAGING — US US THYROID BIOPSY
1 series · 10 of 10 positions shown · non-contrast
Comparison: none

INDICATION: Left lobectomy.  Dominant right lobe nodule has enlarged.

[Series 1: us thyroid biopsy · 0.07mm/px · 10 acquisitions, 10 frames shown]
[im 1/10]
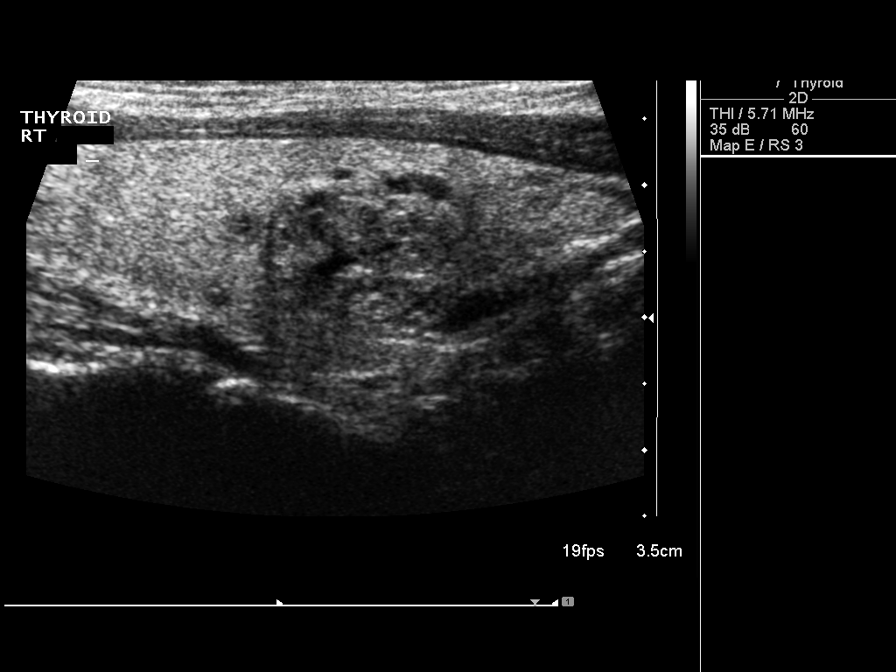
[im 2/10]
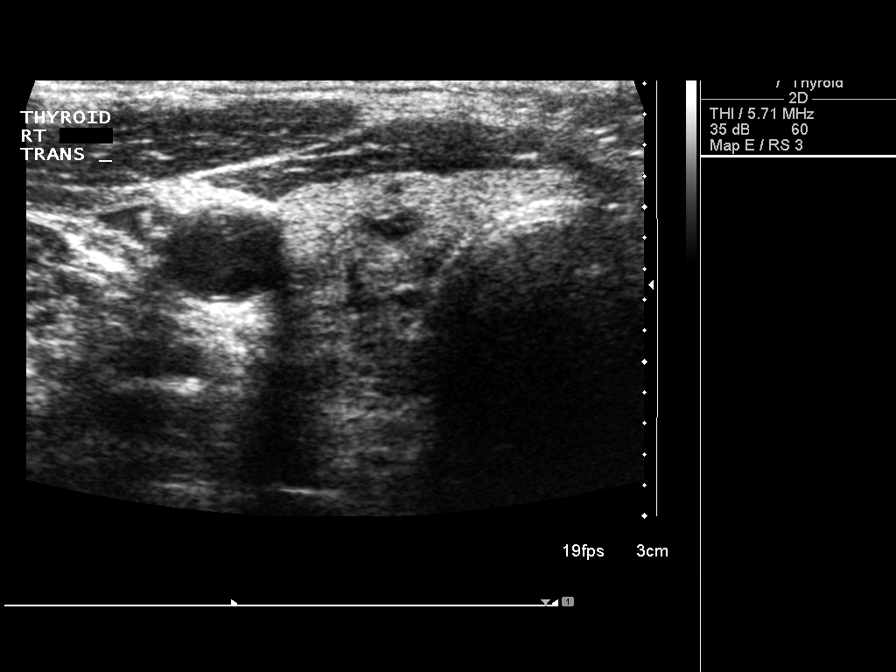
[im 3/10]
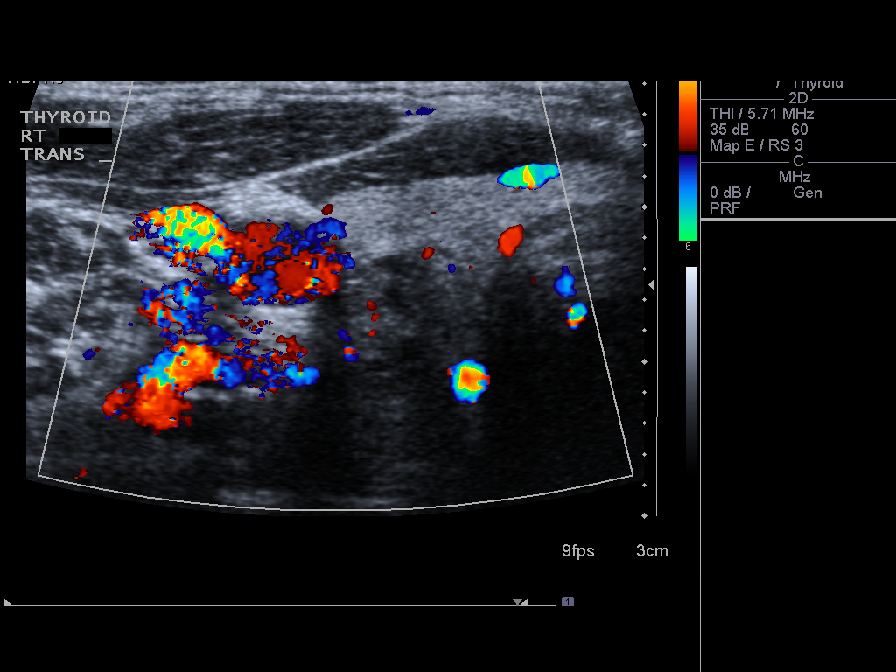
[im 4/10]
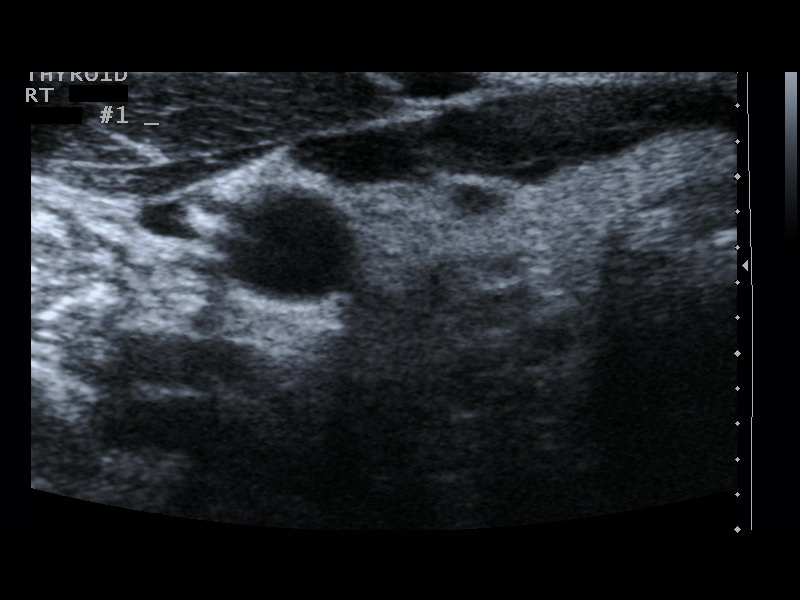
[im 5/10]
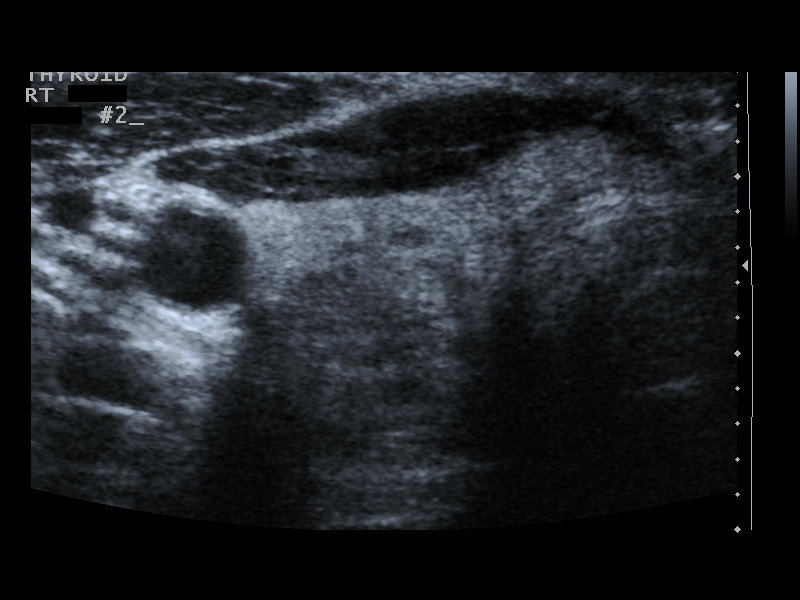
[im 6/10]
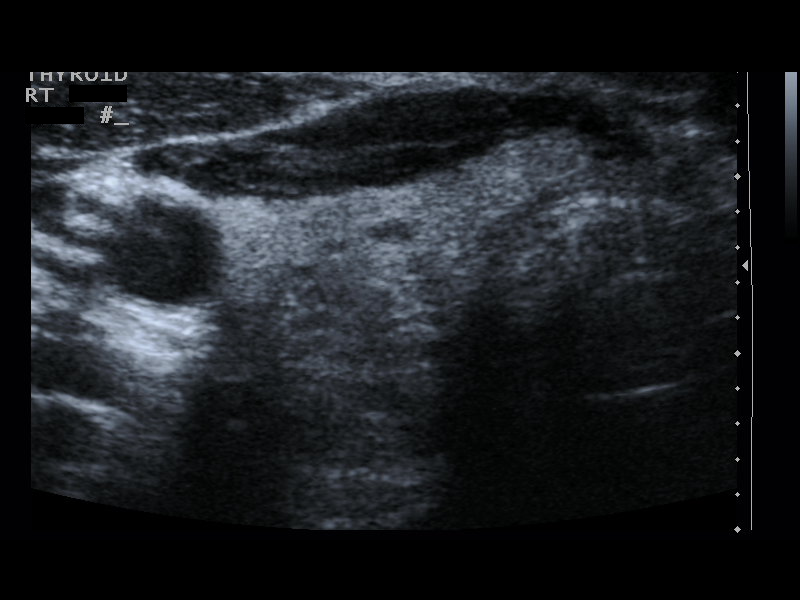
[im 7/10]
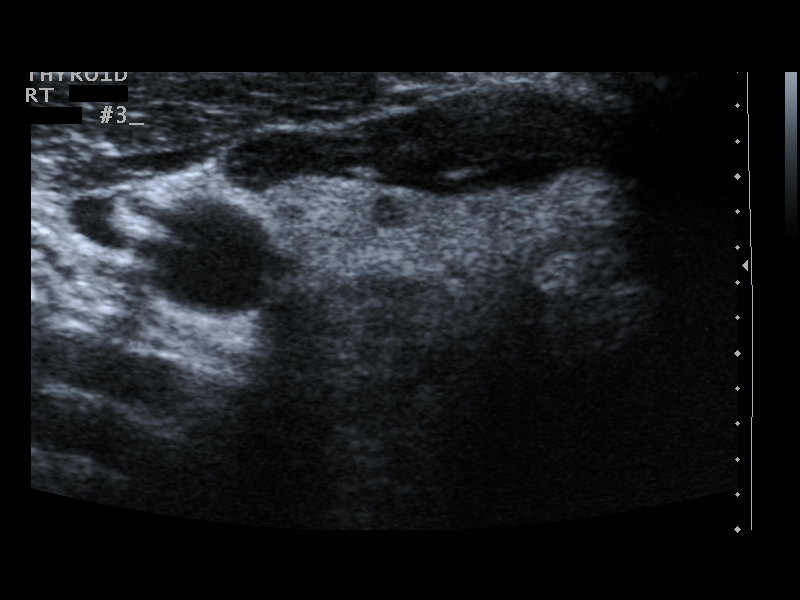
[im 8/10]
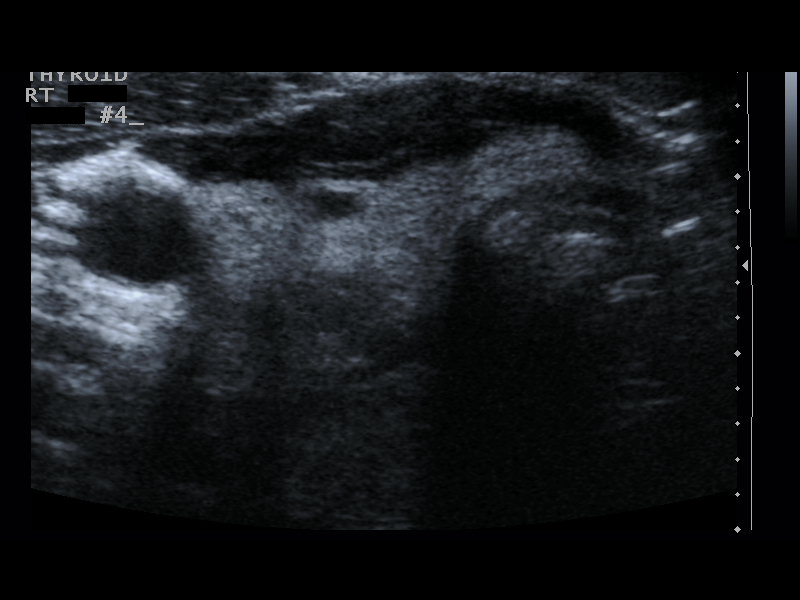
[im 9/10]
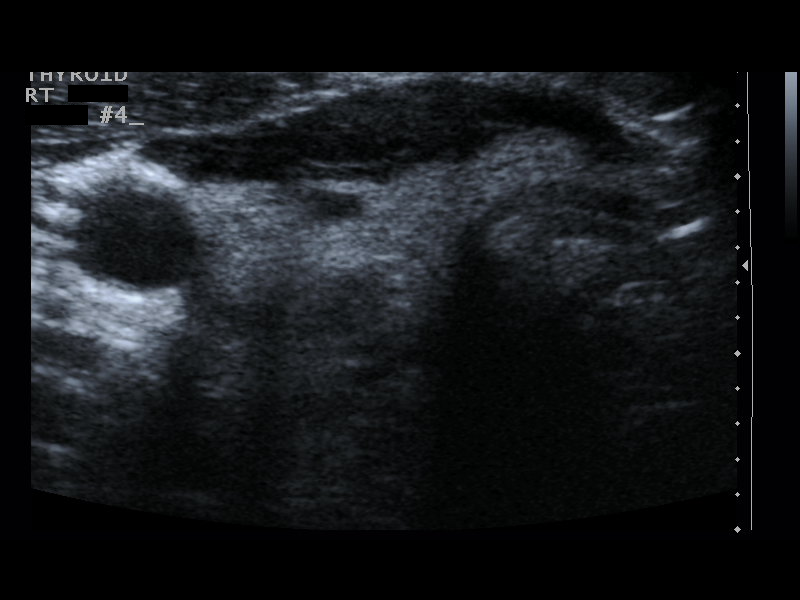
[im 10/10]
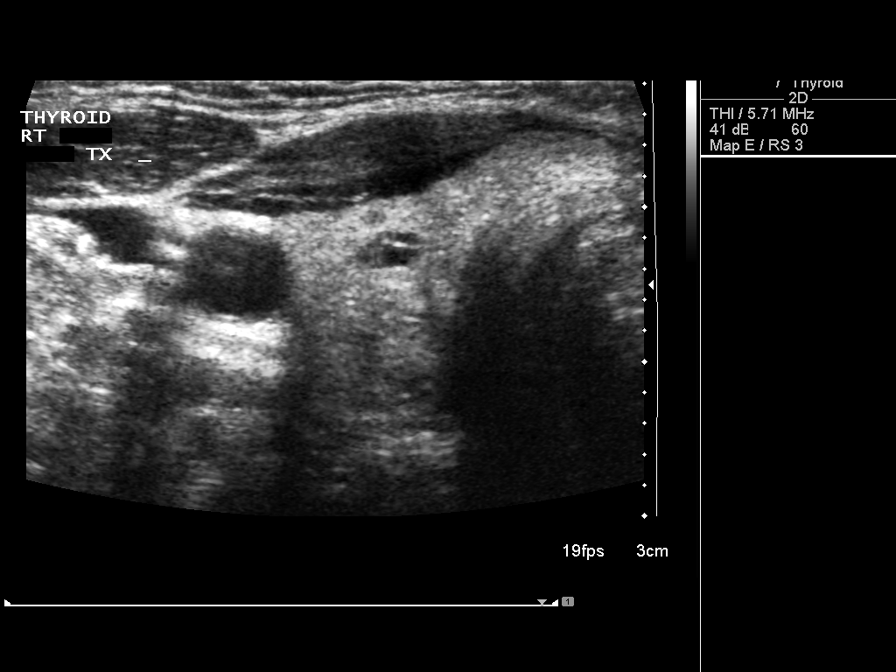

[10 of 10 positions shown; findings below may reference images not displayed]

EXAM:
ULTRASOUND GUIDED NEEDLE ASPIRATE BIOPSY OF THE THYROID GLAND

MEDICATIONS:
None.

ANESTHESIA/SEDATION:
None

FLUOROSCOPY TIME:  None

COMPLICATIONS:
None immediate.

PROCEDURE:
Thyroid biopsy was thoroughly discussed with the patient and
questions were answered. The benefits, risks, alternatives, and
complications were also discussed. The patient understands and
wishes to proceed with the procedure. Written consent was obtained.

Ultrasound was performed to localize and mark an adequate site for
the biopsy. The patient was then prepped and draped in a normal
sterile fashion. Local anesthesia was provided with 1% lidocaine.
Using direct ultrasound guidance, 4 passes were made using needles
into the nodule within the right lobe of the thyroid. Ultrasound was
used to confirm needle placements on all occasions. Specimens were
sent to Pathology for analysis.
FINDINGS: Images document needle placement in the dominant right lower pole
nodule.
IMPRESSION: Ultrasound guided needle aspirate biopsy performed of the right
lower dominant thyroid nodule.

## 2016-05-05 NOTE — Progress Notes (Signed)
61 y.o. G37P2002 Married Caucasian female here for annual exam.    Has first degree uterine prolapse and rectocele. May be getting worse.  Had a third degree laceration with delivery.  Some fecal soiling if is in a hurry.  Occurs a couple times a month.  No stress incontinence.   Is increasing fiber.   Sees Dr. Forde Dandy. Due for a thyroid ultrasound.  Will do Shingles vaccine through his office.   PCP:   Reynold Bowen, MD  Patient's last menstrual period was 08/21/2010 (exact date).       Sexually active: Yes.   female The current method of family planning is post menopausal status.    Exercising: Yes.    Walking Smoker:  no  Health Maintenance: Pap:  03-28-13 Neg:Neg HR HPV; 10-08-09 Neg History of abnormal Pap:  no MMG:  03-20-13 Density D/Neg/BiRads1:TBC.  She will schedule.  Colonoscopy:  12/2012 normal with Dr. Alferd Apa GI);next due 12/2017 due to family hx colon cancer in mom.  BMD:   n/a  Result  n/a TDaP:  03/2006 Gardasil:   N/A HIV:  Will do with PCP. Hep C:  Will do with PCP. Screening Labs:  Hb today: PCP, Urine today: not done   reports that she has never smoked. She has never used smokeless tobacco. She reports that she does not drink alcohol or use drugs.  Past Medical History:  Diagnosis Date  . Fibroid   . Thyroid disease    thyroidectomy-partial    Past Surgical History:  Procedure Laterality Date  . APPENDECTOMY  02/1999  . BREAST SURGERY  1989   rt. breast bx--benign  . ENDOMETRIAL BIOPSY  10-08-2009   -benign proliferative endometrium  . THYROIDECTOMY, PARTIAL  1999    Current Outpatient Prescriptions  Medication Sig Dispense Refill  . levothyroxine (SYNTHROID, LEVOTHROID) 75 MCG tablet Take 75 mcg by mouth daily before breakfast. Takes 6 days a week     No current facility-administered medications for this visit.     Family History  Problem Relation Age of Onset  . Hypertension Mother   . Cancer Mother 89    Rectal cancer  . Stroke  Mother   . Atrial fibrillation Father   . Hypertension Father   . Other Father     hx ruptured appendix w/sepsis  . Breast cancer Maternal Aunt   . Stroke Paternal Grandfather     ROS:  Pertinent items are noted in HPI.  Otherwise, a comprehensive ROS was negative.  Exam:   BP 122/62 (BP Location: Right Arm, Patient Position: Sitting, Cuff Size: Normal)   Pulse 80   Resp 14   Ht 5\' 4"  (1.626 m)   Wt 122 lb 9.6 oz (55.6 kg)   LMP 08/21/2010 (Exact Date)   BMI 21.04 kg/m     General appearance: alert, cooperative and appears stated age Head: Normocephalic, without obvious abnormality, atraumatic Neck: no adenopathy, supple, symmetrical, trachea midline and thyroid normal to inspection and palpation Lungs: clear to auscultation bilaterally Breasts: normal appearance, no masses or tenderness, No nipple retraction or dimpling, No nipple discharge or bleeding, No axillary or supraclavicular adenopathy Heart: regular rate and rhythm Abdomen: soft, non-tender; no masses, no organomegaly Extremities: extremities normal, atraumatic, no cyanosis or edema Skin: Skin color, texture, turgor normal. No rashes or lesions Lymph nodes: Cervical, supraclavicular, and axillary nodes normal. No abnormal inguinal nodes palpated Neurologic: Grossly normal  Pelvic: External genitalia:  no lesions  Urethra:  normal appearing urethra with no masses, tenderness or lesions              Bartholins and Skenes: normal                 Vagina: normal appearing vagina with normal color and discharge, no lesions              Cervix: no lesions              Pap taken: Yes.   Bimanual Exam:  Uterus:  normal size, contour, position, consistency, mobility, non-tender.  First degree uterine prolapse, minimal rectocele.              Adnexa: no mass, fullness, tenderness              Rectal exam: Yes.  .  Confirms.              Anus:  normal sphincter tone, no lesions  Chaperone was present for  exam.  Assessment:   Well woman visit with normal exam. Incomplete uterovaginal prolapse. Some fecal soiling.  FH of rectal cancer.  Status post partial thyroidectomy.  Hx of gestational diabetes.   Plan: Mammogram screening discussed. Recommended self breast awareness. Pap and HR HPV as above. Guidelines for Calcium, Vitamin D, regular exercise program including cardiovascular and weight bearing exercise. TDap.  Metamucil.  Kegel's.  Declines surgical correction.  Discussed pessary just as a reminder of this option.  Follow up annually and prn.   After visit summary provided.

## 2016-05-07 ENCOUNTER — Ambulatory Visit (INDEPENDENT_AMBULATORY_CARE_PROVIDER_SITE_OTHER): Payer: PRIVATE HEALTH INSURANCE | Admitting: Obstetrics and Gynecology

## 2016-05-07 ENCOUNTER — Encounter: Payer: Self-pay | Admitting: Obstetrics and Gynecology

## 2016-05-07 VITALS — BP 122/62 | HR 80 | Resp 14 | Ht 64.0 in | Wt 122.6 lb

## 2016-05-07 DIAGNOSIS — Z23 Encounter for immunization: Secondary | ICD-10-CM | POA: Diagnosis not present

## 2016-05-07 DIAGNOSIS — Z01419 Encounter for gynecological examination (general) (routine) without abnormal findings: Secondary | ICD-10-CM

## 2016-05-07 NOTE — Patient Instructions (Signed)

## 2016-05-12 LAB — IPS PAP TEST WITH HPV

## 2016-05-17 ENCOUNTER — Other Ambulatory Visit: Payer: Self-pay | Admitting: Endocrinology

## 2016-05-17 DIAGNOSIS — E041 Nontoxic single thyroid nodule: Secondary | ICD-10-CM

## 2016-05-20 ENCOUNTER — Ambulatory Visit
Admission: RE | Admit: 2016-05-20 | Discharge: 2016-05-20 | Disposition: A | Discharge status: Home / Self Care | Payer: PRIVATE HEALTH INSURANCE | Source: Ambulatory Visit | Attending: Endocrinology | Admitting: Endocrinology

## 2016-05-20 DIAGNOSIS — E041 Nontoxic single thyroid nodule: Secondary | ICD-10-CM

## 2017-01-27 ENCOUNTER — Other Ambulatory Visit: Payer: Self-pay | Admitting: Endocrinology

## 2017-01-27 DIAGNOSIS — Z1231 Encounter for screening mammogram for malignant neoplasm of breast: Secondary | ICD-10-CM

## 2017-02-28 ENCOUNTER — Ambulatory Visit: Payer: PRIVATE HEALTH INSURANCE

## 2017-04-27 ENCOUNTER — Other Ambulatory Visit: Payer: Self-pay | Admitting: Endocrinology

## 2017-04-27 DIAGNOSIS — E042 Nontoxic multinodular goiter: Secondary | ICD-10-CM

## 2017-05-02 ENCOUNTER — Ambulatory Visit
Admission: RE | Admit: 2017-05-02 | Discharge: 2017-05-02 | Disposition: A | Discharge status: Home / Self Care | Payer: PRIVATE HEALTH INSURANCE | Source: Ambulatory Visit | Attending: Endocrinology | Admitting: Endocrinology

## 2017-05-02 ENCOUNTER — Other Ambulatory Visit: Payer: Self-pay | Admitting: Endocrinology

## 2017-05-02 DIAGNOSIS — E042 Nontoxic multinodular goiter: Secondary | ICD-10-CM

## 2017-05-02 DIAGNOSIS — E039 Hypothyroidism, unspecified: Secondary | ICD-10-CM

## 2017-05-02 DIAGNOSIS — E041 Nontoxic single thyroid nodule: Secondary | ICD-10-CM

## 2017-05-02 DIAGNOSIS — Z1231 Encounter for screening mammogram for malignant neoplasm of breast: Secondary | ICD-10-CM

## 2017-05-03 ENCOUNTER — Ambulatory Visit
Admission: RE | Admit: 2017-05-03 | Discharge: 2017-05-03 | Disposition: A | Discharge status: Home / Self Care | Payer: PRIVATE HEALTH INSURANCE | Source: Ambulatory Visit | Attending: Endocrinology | Admitting: Endocrinology

## 2017-05-03 ENCOUNTER — Other Ambulatory Visit: Payer: Self-pay | Admitting: Endocrinology

## 2017-05-03 DIAGNOSIS — R928 Other abnormal and inconclusive findings on diagnostic imaging of breast: Secondary | ICD-10-CM

## 2017-05-06 ENCOUNTER — Inpatient Hospital Stay: Admission: RE | Admit: 2017-05-06 | Payer: PRIVATE HEALTH INSURANCE | Source: Ambulatory Visit

## 2017-05-06 ENCOUNTER — Other Ambulatory Visit: Payer: PRIVATE HEALTH INSURANCE

## 2017-05-26 ENCOUNTER — Ambulatory Visit: Payer: PRIVATE HEALTH INSURANCE | Admitting: Obstetrics and Gynecology

## 2017-07-25 ENCOUNTER — Ambulatory Visit: Payer: PRIVATE HEALTH INSURANCE | Admitting: Obstetrics and Gynecology

## 2017-08-17 ENCOUNTER — Other Ambulatory Visit: Payer: Self-pay | Admitting: Endocrinology

## 2017-08-17 DIAGNOSIS — E041 Nontoxic single thyroid nodule: Secondary | ICD-10-CM

## 2017-10-24 ENCOUNTER — Ambulatory Visit: Payer: PRIVATE HEALTH INSURANCE | Admitting: Obstetrics and Gynecology

## 2017-10-24 ENCOUNTER — Telehealth: Payer: Self-pay | Admitting: Obstetrics and Gynecology

## 2017-10-24 NOTE — Telephone Encounter (Signed)
Left message on voicemail to call and reschedule cancelled appointment. °

## 2017-12-26 ENCOUNTER — Ambulatory Visit: Payer: PRIVATE HEALTH INSURANCE | Admitting: Obstetrics and Gynecology

## 2018-01-09 ENCOUNTER — Other Ambulatory Visit: Payer: Self-pay

## 2018-01-09 ENCOUNTER — Encounter: Payer: Self-pay | Admitting: Obstetrics and Gynecology

## 2018-01-09 ENCOUNTER — Ambulatory Visit (INDEPENDENT_AMBULATORY_CARE_PROVIDER_SITE_OTHER): Payer: PRIVATE HEALTH INSURANCE | Admitting: Obstetrics and Gynecology

## 2018-01-09 VITALS — BP 118/68 | HR 76 | Resp 14 | Ht 64.0 in | Wt 121.8 lb

## 2018-01-09 DIAGNOSIS — Z01419 Encounter for gynecological examination (general) (routine) without abnormal findings: Secondary | ICD-10-CM | POA: Diagnosis not present

## 2018-01-09 NOTE — Patient Instructions (Signed)

## 2018-01-09 NOTE — Progress Notes (Signed)
62 y.o. G60P2002 Married Caucasian female here for annual exam.    Prolapse is about the same.  Bladder control is ok. Exercise is not so much an issue but lifting causes the leakage. Wears a panty liner. Not ready for surgery.  Has a bunion that needs assistance also..   Father passed this year.  Mother is still living.   PCP: Reynold Bowen, MD   Patient's last menstrual period was 08/21/2010 (exact date).           Sexually active: Yes.   female The current method of family planning is post menopausal status.    Exercising: Yes.    walking and stretching Smoker:  no  Health Maintenance: Pap:  05-07-16 Neg:Neg HR HPV,  03-28-13 Neg:Neg HR HPV History of abnormal Pap:  no MMG: 05-02-17 Density D/poss.7mm asymmetry Rt.Br., Lt.Br.neg--Rt.Diag.and U/S reveal extremely dense fibroglandular tissue/Neg/BiRads1 Colonoscopy: 12/2012 normal;next due 12/2017 BMD: n/a    Result  n/a TDaP:  05-07-16 Gardasil:   no HIV: Unsure Hep C:Unsure Screening Labs:  Hb today: PCP    reports that she has never smoked. She has never used smokeless tobacco. She reports that she does not drink alcohol or use drugs.  Past Medical History:  Diagnosis Date  . Fibroid   . Thyroid disease    thyroidectomy-partial    Past Surgical History:  Procedure Laterality Date  . APPENDECTOMY  02/1999  . BREAST EXCISIONAL BIOPSY Right 1989  . BREAST SURGERY  1989   rt. breast bx--benign  . ENDOMETRIAL BIOPSY  10-08-2009   -benign proliferative endometrium  . THYROIDECTOMY, PARTIAL  1999    Current Outpatient Medications  Medication Sig Dispense Refill  . levothyroxine (SYNTHROID, LEVOTHROID) 75 MCG tablet Take 75 mcg by mouth daily before breakfast. Takes 6 days a week     No current facility-administered medications for this visit.     Family History  Problem Relation Age of Onset  . Hypertension Mother   . Cancer Mother 17       Rectal cancer  . Stroke Mother   . Atrial fibrillation Father   .  Hypertension Father   . Other Father        hx ruptured appendix w/sepsis  . Breast cancer Maternal Aunt   . Stroke Paternal Grandfather     Review of Systems  All other systems reviewed and are negative.   Exam:   BP 118/68 (BP Location: Right Arm, Patient Position: Sitting, Cuff Size: Normal)   Pulse 76   Resp 14   Ht 5\' 4"  (1.626 m)   Wt 121 lb 12.8 oz (55.2 kg)   LMP 08/21/2010 (Exact Date)   BMI 20.91 kg/m     General appearance: alert, cooperative and appears stated age Head: Normocephalic, without obvious abnormality, atraumatic Neck: no adenopathy, supple, symmetrical, trachea midline and thyroid normal to inspection and palpation Lungs: clear to auscultation bilaterally Breasts: normal appearance, no masses or tenderness, No nipple retraction or dimpling, No nipple discharge or bleeding, No axillary or supraclavicular adenopathy Heart: regular rate and rhythm Abdomen: soft, non-tender; no masses, no organomegaly Extremities: extremities normal, atraumatic, no cyanosis or edema Skin: Skin color, texture, turgor normal. No rashes or lesions Lymph nodes: Cervical, supraclavicular, and axillary nodes normal. No abnormal inguinal nodes palpated Neurologic: Grossly normal  Pelvic: External genitalia:  no lesions              Urethra:  normal appearing urethra with no masses, tenderness or lesions  Bartholins and Skenes: normal                 Vagina: normal appearing vagina with normal color and discharge, no lesions              Cervix: no lesions              Pap taken: No. Bimanual Exam:  Uterus:  normal size, contour, position, consistency, mobility, non-tender              Adnexa: no mass, fullness, tenderness              Rectal exam: Yes.  .  Confirms.              Anus:  normal sphincter tone, no lesions  Chaperone was present for exam.  Assessment:   Well woman visit with normal exam. Dense breast tissue, cat D.  Incomplete uterovaginal  prolapse.  Anatomy looks and palpates normal today.  Fecal soiling - resolved with fiber in diet.  FH of rectal cancer.  Status post partial thyroidectomy.  Hx of gestational diabetes.   Plan: Mammogram screening. 3D recommended.  Recommended self breast awareness. Pap and HR HPV as above. Guidelines for Calcium, Vitamin D, regular exercise program including cardiovascular and weight bearing exercise. We discussed BMD which can be done at any time.  She will do at [place] when the time comes.  Follow up annually and prn.   After visit summary provided.

## 2018-04-21 DIAGNOSIS — H40023 Open angle with borderline findings, high risk, bilateral: Secondary | ICD-10-CM | POA: Diagnosis not present

## 2018-04-21 DIAGNOSIS — H43813 Vitreous degeneration, bilateral: Secondary | ICD-10-CM | POA: Diagnosis not present

## 2018-04-21 DIAGNOSIS — H2513 Age-related nuclear cataract, bilateral: Secondary | ICD-10-CM | POA: Diagnosis not present

## 2018-06-18 IMAGING — US US THYROID
1 series · 13 of 25 positions shown · non-contrast
Comparison: Prior thyroid ultrasound 04/23/2015 and 03/28/2011 ;
prior thyroid nodule biopsy 05/08/15

CLINICAL DATA: 60-year-old female with a history of thyroid
nodules. She underwent biopsy of an enlarging right-sided thyroid
nodule on 05/08/15

EXAM:
THYROID ULTRASOUND
TECHNIQUE: Ultrasound examination of the thyroid gland and adjacent soft
tissues was performed.

[Series 1: us thyroid · 0.04mm/px · 13 of 57 slices shown]
[im 1/57]
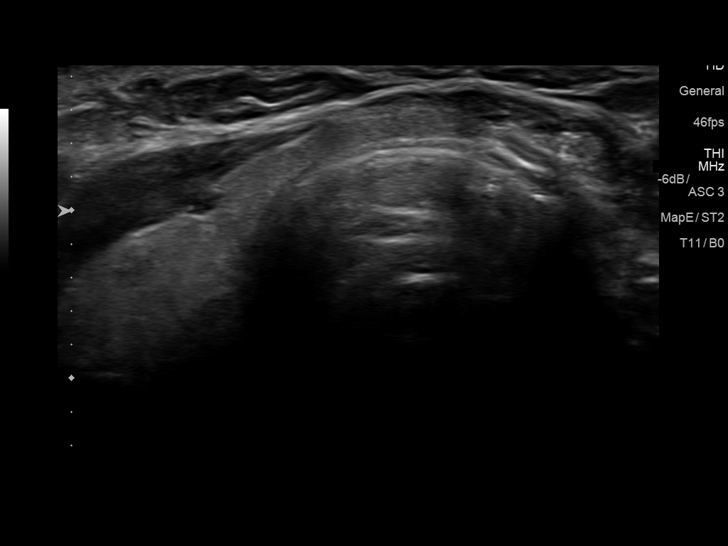
[im 5/57]
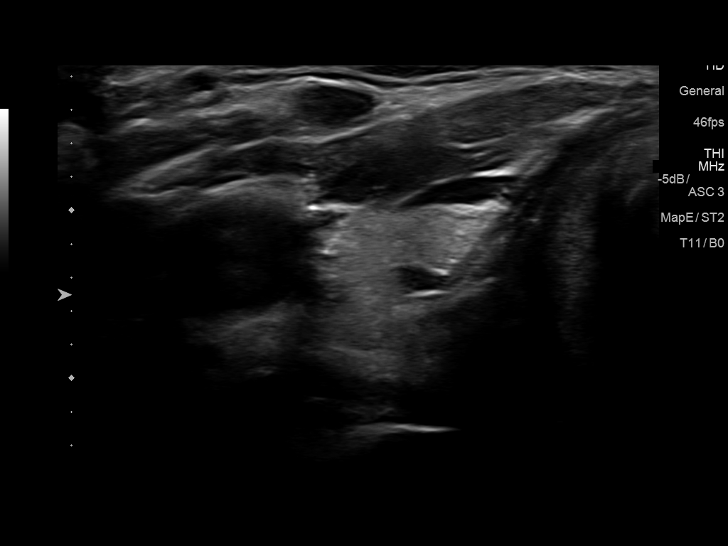
[im 10/57]
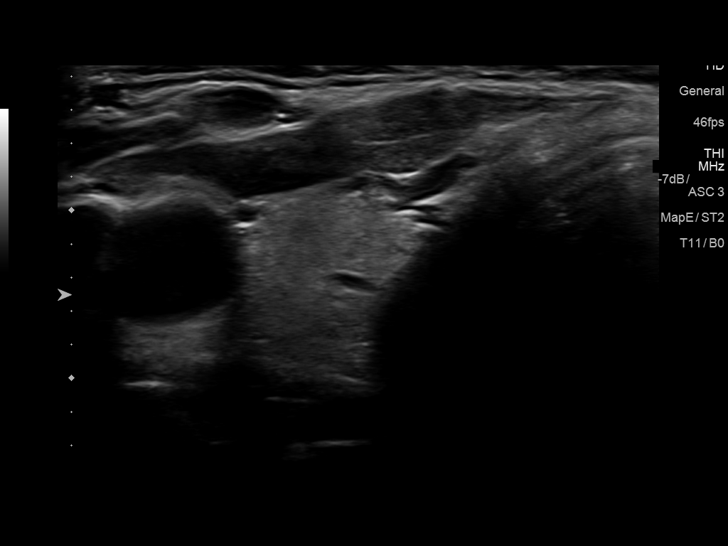
[im 15/57]
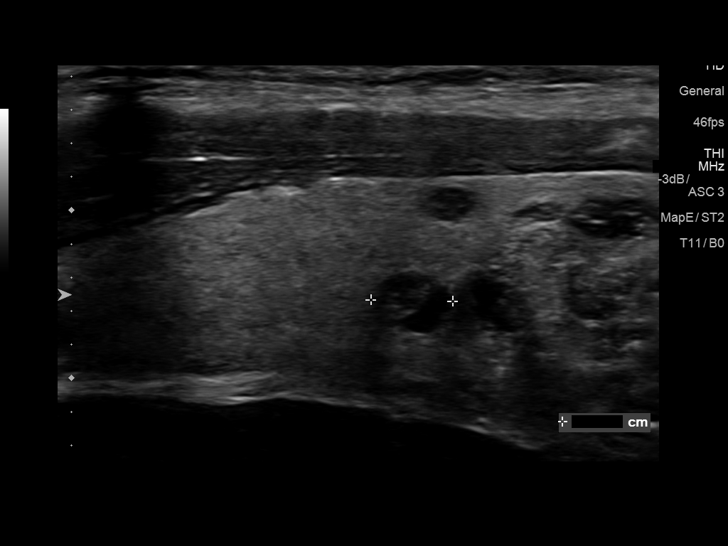
[im 19/57]
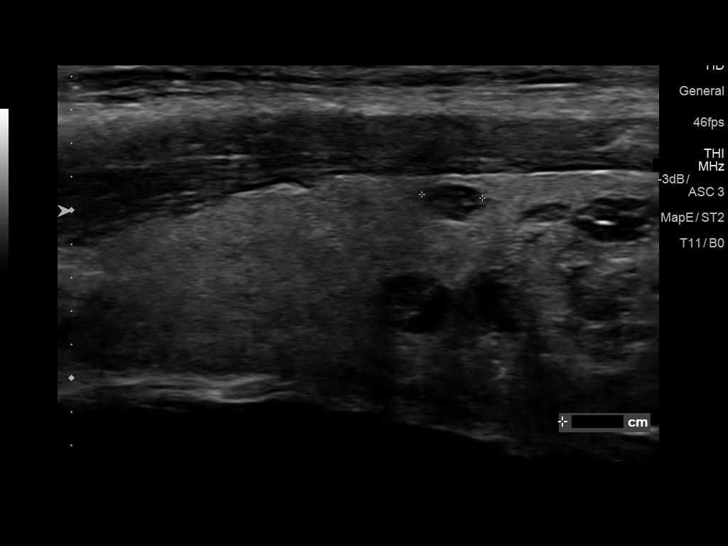
[im 24/57]
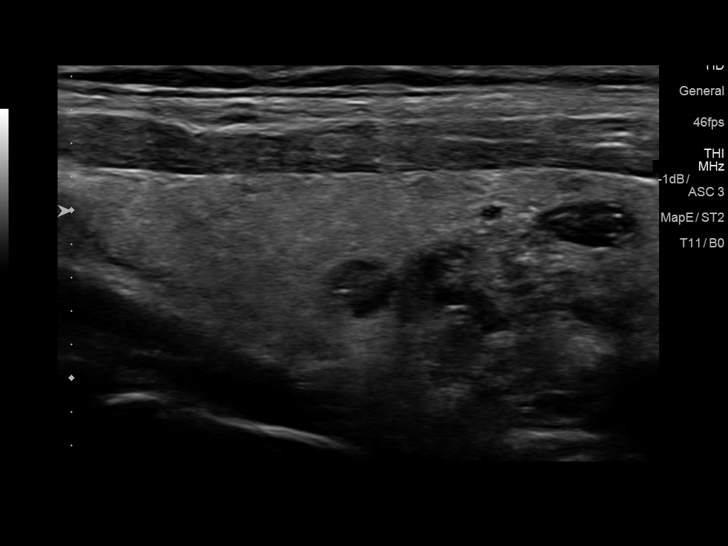
[im 29/57]
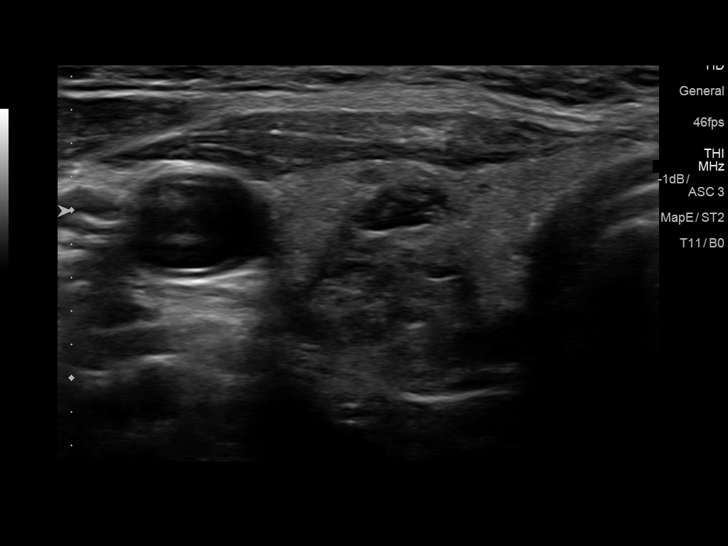
[im 33/57]
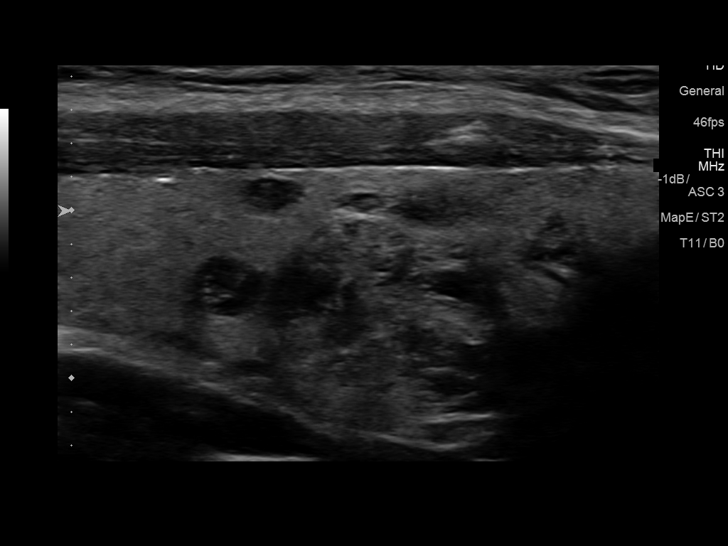
[im 38/57]
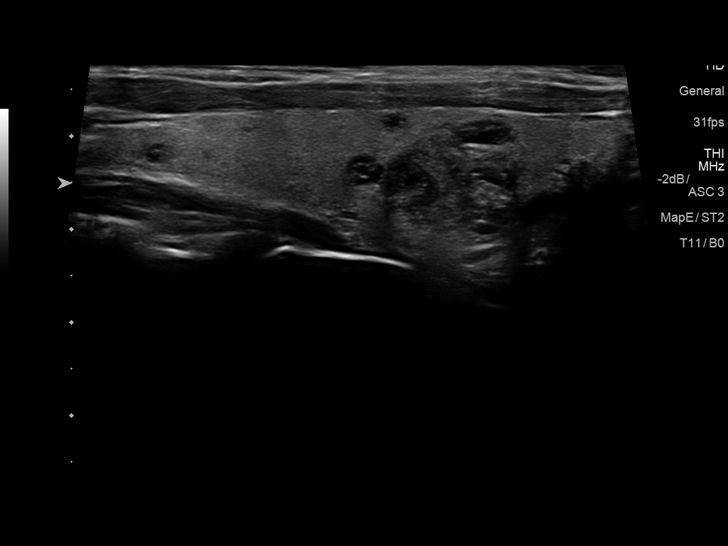
[im 43/57]
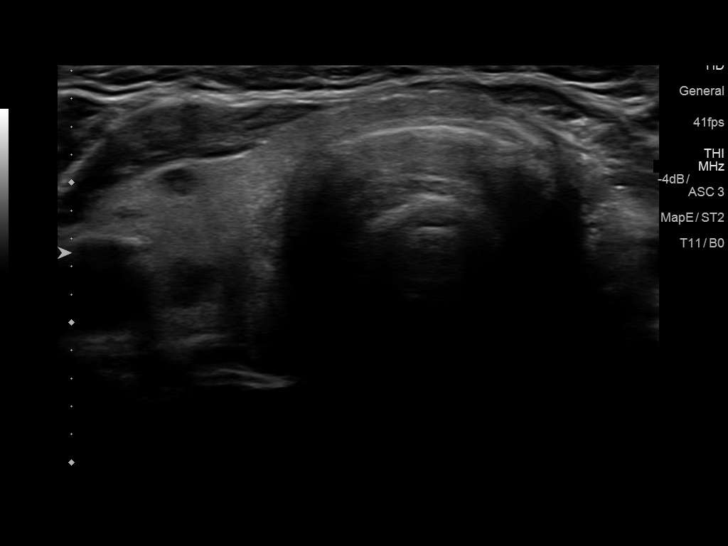
[im 47/57]
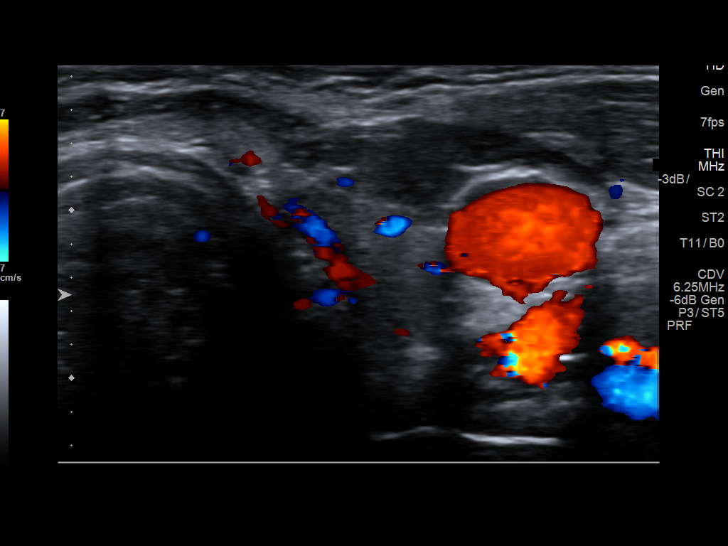
[im 52/57]
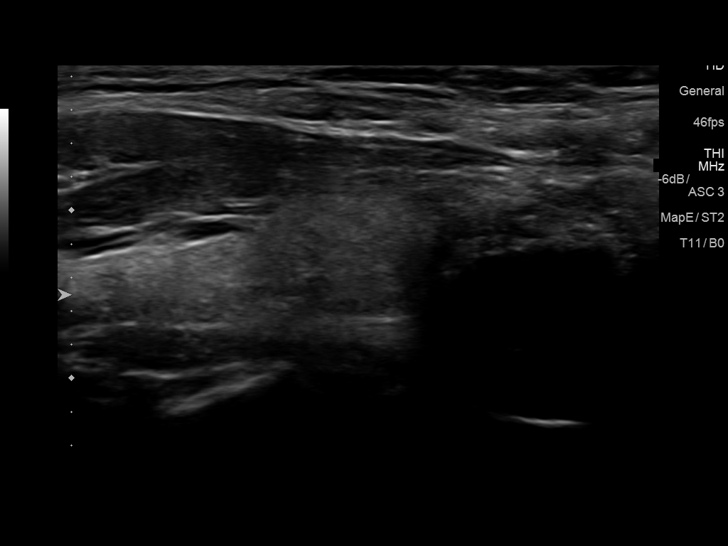
[im 57/57]
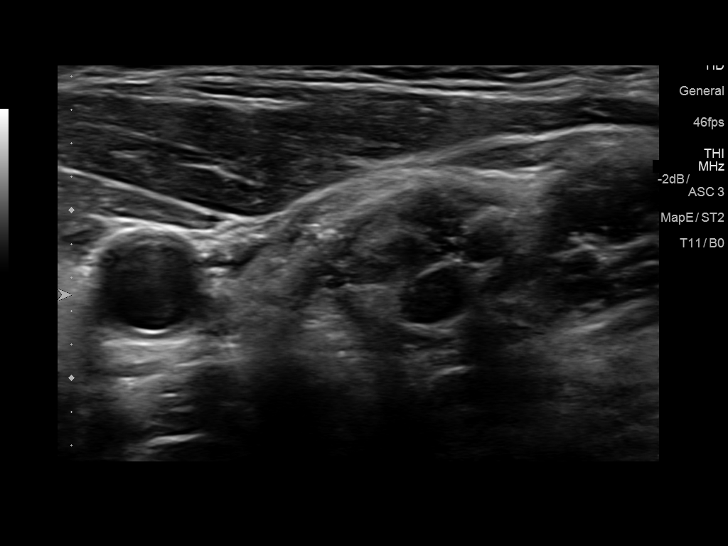

[13 of 25 positions shown; findings below may reference images not displayed]

FINDINGS: Parenchymal Echotexture: Mildly heterogenous

Isthmus: 0.2 cm

Right lobe: 5.6 x 1.6 x 1.3 cm

Left lobe: 1.6 x 0.7 x 0.7 cm

_________________________________________________________

Estimated total number of nodules >/= 1 cm: 1

Number of spongiform nodules >/=  2 cm not described below (TR1): 0

Number of mixed cystic and solid nodules >/= 1.5 cm not described
below (TR2): 0

The previously biopsied heterogeneous nodule in the right mid to
lower gland has slightly decreased in size measuring 1.4 x 1.0 x
cm today compared to 2.0 x 1.1 x 1.3 cm previously. There are
several additional small hypoechoic nodules scattered throughout the
right gland which all measure 0.5 cm or less. These nodules do not
require either imaging surveillance or biopsy.
IMPRESSION: 1. The previously biopsied nodule in the right mid to lower gland
has decreased in size compared to 04/23/2015. Involution over time is
consistent with benignity.
2. Additional small (less than 1 cm) scattered nodules noted
incidentally. These nodules do not meet criteria for continued
imaging surveillance or biopsy.
3. Stable appearance of residual left thyroid tissue.
The above is in keeping with the ACR TI-RADS recommendations - [HOSPITAL] 7091;[DATE].

## 2018-10-18 DIAGNOSIS — Z0000 Encounter for general adult medical examination without abnormal findings: Secondary | ICD-10-CM | POA: Diagnosis not present

## 2018-10-18 DIAGNOSIS — E038 Other specified hypothyroidism: Secondary | ICD-10-CM | POA: Diagnosis not present

## 2018-10-25 DIAGNOSIS — Z1331 Encounter for screening for depression: Secondary | ICD-10-CM | POA: Diagnosis not present

## 2018-10-25 DIAGNOSIS — Z0000 Encounter for general adult medical examination without abnormal findings: Secondary | ICD-10-CM | POA: Diagnosis not present

## 2018-10-25 DIAGNOSIS — Z8632 Personal history of gestational diabetes: Secondary | ICD-10-CM | POA: Diagnosis not present

## 2018-10-25 DIAGNOSIS — E041 Nontoxic single thyroid nodule: Secondary | ICD-10-CM | POA: Diagnosis not present

## 2018-10-25 DIAGNOSIS — N6019 Diffuse cystic mastopathy of unspecified breast: Secondary | ICD-10-CM | POA: Diagnosis not present

## 2018-10-25 DIAGNOSIS — E039 Hypothyroidism, unspecified: Secondary | ICD-10-CM | POA: Diagnosis not present

## 2019-01-09 DIAGNOSIS — H40023 Open angle with borderline findings, high risk, bilateral: Secondary | ICD-10-CM | POA: Diagnosis not present

## 2019-01-09 DIAGNOSIS — H2513 Age-related nuclear cataract, bilateral: Secondary | ICD-10-CM | POA: Diagnosis not present

## 2019-01-09 DIAGNOSIS — H43813 Vitreous degeneration, bilateral: Secondary | ICD-10-CM | POA: Diagnosis not present

## 2019-01-15 ENCOUNTER — Other Ambulatory Visit: Payer: Self-pay

## 2019-01-15 NOTE — Progress Notes (Signed)
63 y.o. G71P2002 Married Caucasian female here for annual exam.    Still with some leakage of urine with lifting and is just a little bit.   She uses a panty shield.  No real leakage with walking.  Kegel's help.  Bowel function is good.  No leakage anymore.  Uses fiber three times a day.   Volunteers in the school.  Labs with PCP.   PCP: Reynold Bowen, MD  Patient's last menstrual period was 08/21/2010 (exact date).           Sexually active: Yes.    The current method of family planning is post menopausal status.    Exercising: Yes.    walks 3 miles/day Smoker:  no  Health Maintenance: Pap:  05-07-16 Neg:Neg HR HPV, 03-28-13 Neg:Neg HR HPV History of abnormal Pap:  no MMG: 05-02-17 Rt.Br.poss.44mm asymmetry, Lt.Br.neg/density D. Diag.Rt.Br.and Rt.[place] Neg/BiRads1/screening 1 year.   Colonoscopy:  12/2012 normal;next was due 12/2017 due to family hx(mom dx'd colon ca in her late 80's)--Patient declines BMD:   n/a  Result  n/a TDaP:  05-07-16 Gardasil:   no [id number] Hep C:Unsure Screening Labs:  PCP.   reports that she has never smoked. She has never used smokeless tobacco. She reports that she does not drink alcohol or use drugs.  Past Medical History:  Diagnosis Date  . Fibroid   . Thyroid disease    thyroidectomy-partial    Past Surgical History:  Procedure Laterality Date  . APPENDECTOMY  02/1999  . BREAST EXCISIONAL BIOPSY Right 1989  . BREAST SURGERY  1989   rt. breast bx--benign  . ENDOMETRIAL BIOPSY  10-08-2009   -benign proliferative endometrium  . THYROIDECTOMY, PARTIAL  1999    Current Outpatient Medications  Medication Sig Dispense Refill  . levothyroxine (SYNTHROID, LEVOTHROID) 75 MCG tablet Take 75 mcg by mouth daily before breakfast. Takes 6 days a week     No current facility-administered medications for this visit.     Family History  Problem Relation Age of Onset  . Hypertension Mother   . Cancer Mother 19       Rectal cancer  . Stroke  Mother   . Atrial fibrillation Father   . Hypertension Father   . Other Father        hx ruptured appendix w/sepsis  . Breast cancer Maternal Aunt   . Stroke Paternal Grandfather     Review of Systems  All other systems reviewed and are negative.   Exam:   BP 120/72   Pulse 70   Temp 97.8 F (36.6 C) (Temporal)   Resp 16   Ht 5\' 4"  (1.626 m)   Wt 122 lb 3.2 oz (55.4 kg)   LMP 08/21/2010 (Exact Date)   BMI 20.98 kg/m     General appearance: alert, cooperative and appears stated age Head: normocephalic, without obvious abnormality, atraumatic Neck: no adenopathy, supple, symmetrical, trachea midline and thyroid normal to inspection and palpation Lungs: clear to auscultation bilaterally Breasts: normal appearance, no masses or tenderness, No nipple retraction or dimpling, No nipple discharge or bleeding, No axillary adenopathy Heart: regular rate and rhythm Abdomen: soft, non-tender; no masses, no organomegaly Extremities: extremities normal, atraumatic, no cyanosis or edema Skin: skin color, texture, turgor normal. No rashes or lesions Lymph nodes: cervical, supraclavicular, and axillary nodes normal. Neurologic: grossly normal  Pelvic: External genitalia:  no lesions              No abnormal inguinal nodes palpated.  Urethra:  normal appearing urethra with no masses, tenderness or lesions              Bartholins and Skenes: normal                 Vagina: normal appearing vagina with normal color and discharge, no lesions              Cervix: no lesions              Pap taken: No. Bimanual Exam:  Uterus:  normal size, contour, position, consistency, mobility, non-tender              Adnexa: no mass, fullness, tenderness              Rectal exam: Yes.  .  Confirms.              Anus:  normal sphincter tone, no lesions  Chaperone was present for exam.  Assessment:   Well woman visit with normal exam. Dense breast tissue, cat D.  Incomplete uterovaginal  prolapse.  Anatomy looks and palpates normal today.  Fecal soiling - resolved with fiber in diet.  FH of rectal cancer.  Status post partial thyroidectomy.  Hx of gestational diabetes.   Plan: Mammogram screening discussed.  I recommend yearly screening.  Self breast awareness reviewed. Pap and HR HPV in 2023. Guidelines for Calcium, Vitamin D, regular exercise program including cardiovascular and weight bearing exercise. Flu vaccine recommended. May do Cologuard through PCP next year.  Labs with PCP.  Follow up annually and prn.   After visit summary provided.

## 2019-01-17 ENCOUNTER — Ambulatory Visit (INDEPENDENT_AMBULATORY_CARE_PROVIDER_SITE_OTHER): Payer: BC Managed Care – PPO | Admitting: Obstetrics and Gynecology

## 2019-01-17 ENCOUNTER — Other Ambulatory Visit: Payer: Self-pay

## 2019-01-17 ENCOUNTER — Encounter: Payer: Self-pay | Admitting: Obstetrics and Gynecology

## 2019-01-17 VITALS — BP 120/72 | HR 70 | Temp 97.8°F | Resp 16 | Ht 64.0 in | Wt 122.2 lb

## 2019-01-17 DIAGNOSIS — Z01419 Encounter for gynecological examination (general) (routine) without abnormal findings: Secondary | ICD-10-CM | POA: Diagnosis not present

## 2019-01-17 NOTE — Patient Instructions (Addendum)
EXERCISE AND DIET:  We recommended that you start or continue a regular exercise program for good health. Regular exercise means any activity that makes your heart beat faster and makes you sweat.  We recommend exercising at least 30 minutes per day at least 3 days a week, preferably 4 or 5.  We also recommend a diet low in fat and sugar.  Inactivity, poor dietary choices and obesity can cause diabetes, heart attack, stroke, and kidney damage, among others.    ALCOHOL AND SMOKING:  Women should limit their alcohol intake to no more than 7 drinks/beers/glasses of wine (combined, not each!) per week. Moderation of alcohol intake to this level decreases your risk of breast cancer and liver damage. And of course, no recreational drugs are part of a healthy lifestyle.  And absolutely no smoking or even second hand smoke. Most people know smoking can cause heart and lung diseases, but did you know it also contributes to weakening of your bones? Aging of your skin?  Yellowing of your teeth and nails?  CALCIUM AND VITAMIN D:  Adequate intake of calcium and Vitamin D are recommended.  The recommendations for exact amounts of these supplements seem to change often, but generally speaking 600 mg of calcium (either carbonate or citrate) and 800 units of Vitamin D per day seems prudent. Certain women may benefit from higher intake of Vitamin D.  If you are among these women, your doctor will have told you during your visit.    PAP SMEARS:  Pap smears, to check for cervical cancer or precancers,  have traditionally been done yearly, although recent scientific advances have shown that most women can have pap smears less often.  However, every woman still should have a physical exam from her gynecologist every year. It will include a breast check, inspection of the vulva and vagina to check for abnormal growths or skin changes, a visual exam of the cervix, and then an exam to evaluate the size and shape of the uterus and  ovaries.  And after 61 years of age, a rectal exam is indicated to check for rectal cancers. We will also provide age appropriate advice regarding health maintenance, like when you should have certain vaccines, screening for sexually transmitted diseases, bone density testing, colonoscopy, mammograms, etc.   MAMMOGRAMS:  All women over 40 years old should have a yearly mammogram. Many facilities now offer a "3D" mammogram, which may cost around $50 extra out of pocket. If possible,  we recommend you accept the option to have the 3D mammogram performed.  It both reduces the number of women who will be called back for extra views which then turn out to be normal, and it is better than the routine mammogram at detecting truly abnormal areas.    COLONOSCOPY:  Colonoscopy to screen for colon cancer is recommended for all women at age 50.  We know, you hate the idea of the prep.  We agree, BUT, having colon cancer and not knowing it is worse!!  Colon cancer so often starts as a polyp that can be seen and removed at colonscopy, which can quite literally save your life!  And if your first colonoscopy is normal and you have no family history of colon cancer, most women don't have to have it again for 10 years.  Once every ten years, you can do something that may end up saving your life, right?  We will be happy to help you get it scheduled when you are ready.    Be sure to check your insurance coverage so you understand how much it will cost.  It may be covered as a preventative service at no cost, but you should check your particular policy.      Kegel Exercises  Kegel exercises can help strengthen your pelvic floor muscles. The pelvic floor is a group of muscles that support your rectum, small intestine, and bladder. In females, pelvic floor muscles also help support the womb (uterus). These muscles help you control the flow of urine and stool. Kegel exercises are painless and simple, and they do not require any  equipment. Your provider may suggest Kegel exercises to:  Improve bladder and bowel control.  Improve sexual response.  Improve weak pelvic floor muscles after surgery to remove the uterus (hysterectomy) or pregnancy (females).  Improve weak pelvic floor muscles after prostate gland removal or surgery (males). Kegel exercises involve squeezing your pelvic floor muscles, which are the same muscles you squeeze when you try to stop the flow of urine or keep from passing gas. The exercises can be done while sitting, standing, or lying down, but it is best to vary your position. Exercises How to do Kegel exercises: 1. Squeeze your pelvic floor muscles tight. You should feel a tight lift in your rectal area. If you are a female, you should also feel a tightness in your vaginal area. Keep your stomach, buttocks, and legs relaxed. 2. Hold the muscles tight for up to 10 seconds. 3. Breathe normally. 4. Relax your muscles. 5. Repeat as told by your health care provider. Repeat this exercise daily as told by your health care provider. Continue to do this exercise for at least 4-6 weeks, or for as long as told by your health care provider. You may be referred to a physical therapist who can help you learn more about how to do Kegel exercises. Depending on your condition, your health care provider may recommend:  Varying how long you squeeze your muscles.  Doing several sets of exercises every day.  Doing exercises for several weeks.  Making Kegel exercises a part of your regular exercise routine. This information is not intended to replace advice given to you by your health care provider. Make sure you discuss any questions you have with your health care provider. Document Released: 02/23/2012 Document Revised: 10/26/2017 Document Reviewed: 10/26/2017 Elsevier Patient Education  2020 [employer].

## 2019-05-31 IMAGING — US US THYROID
1 series · 13 of 25 positions shown · non-contrast
Comparison: 05/20/2016, 05/08/2015, 04/23/2015

ADDENDUM:
Addendum created after schedule with referring physician regarding
right inferior thyroid nodule.

Previous biopsy of the right inferior nodule was performed
05/08/2015. Biopsy result is [REDACTED] 2 category, benign.
No further specific follow-up imaging is necessary for a benign
nodule such as this, according to updated TIRADS criteria.
Recommendations follow those established by the new ACR TI-RADS
criteria ([HOSPITAL] 8789;[DATE]).
CLINICAL DATA: 61-year-old female with a history of thyroid
nodules. Prior left thyroidectomy
Biopsy of right lower thyroid nodule 05/08/2015
EXAM:
THYROID ULTRASOUND
TECHNIQUE: Ultrasound examination of the thyroid gland and adjacent soft
tissues was performed.

[Series 1: us thyroid · 0.05mm/px · 13 of 38 slices shown]
[im 1/38]
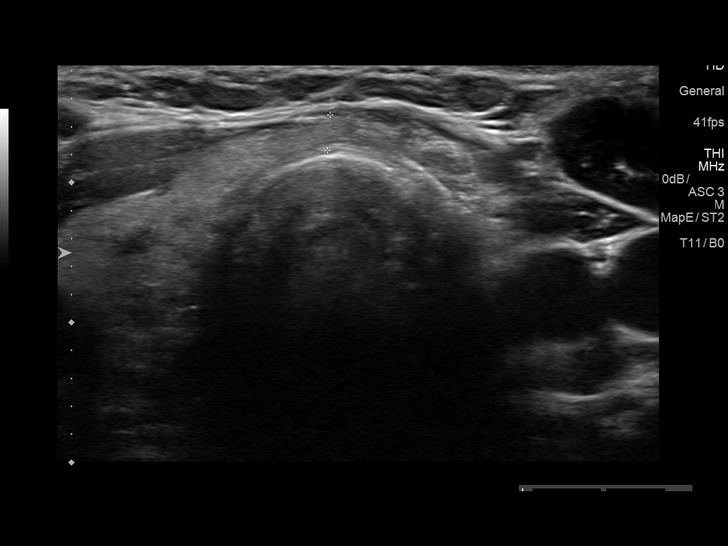
[im 4/38]
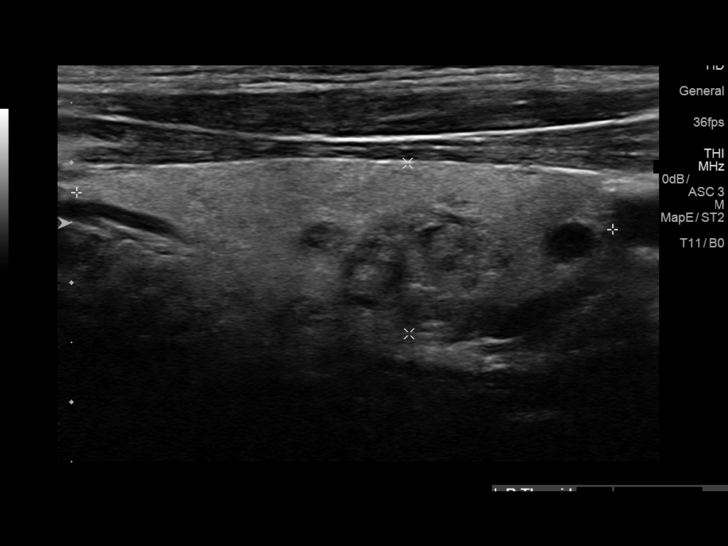
[im 7/38]
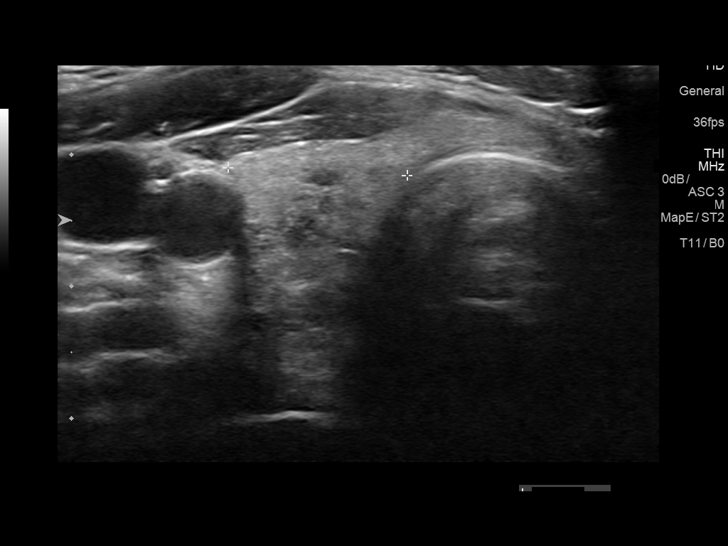
[im 10/38]
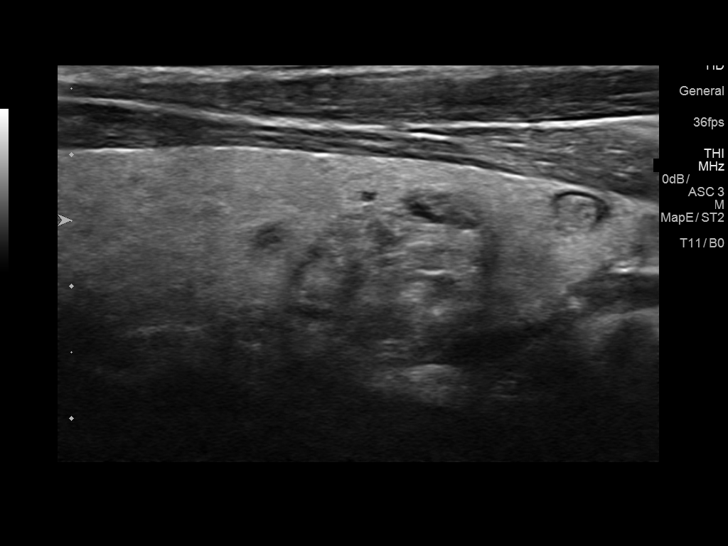
[im 13/38]
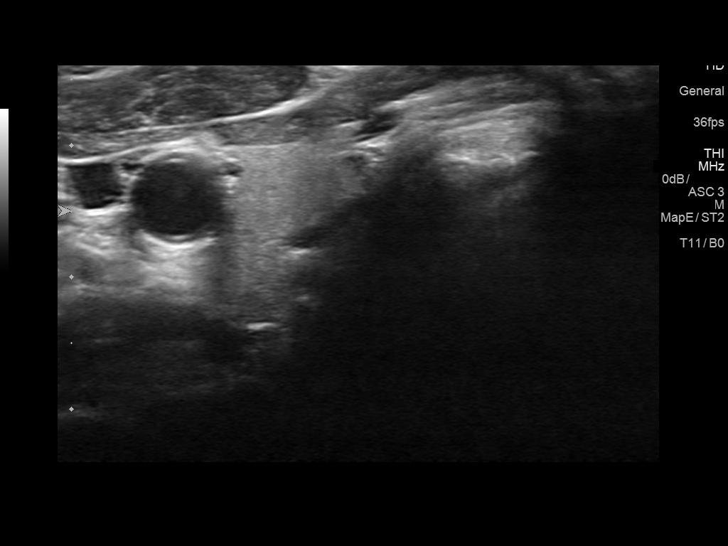
[im 16/38]
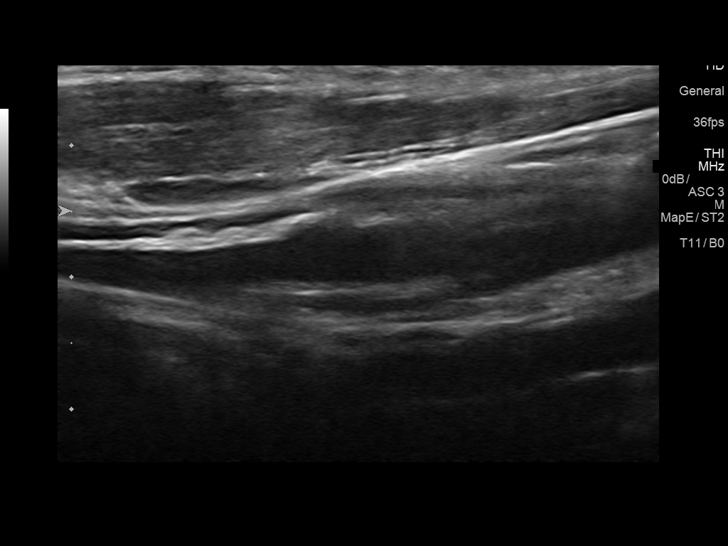
[im 19/38]
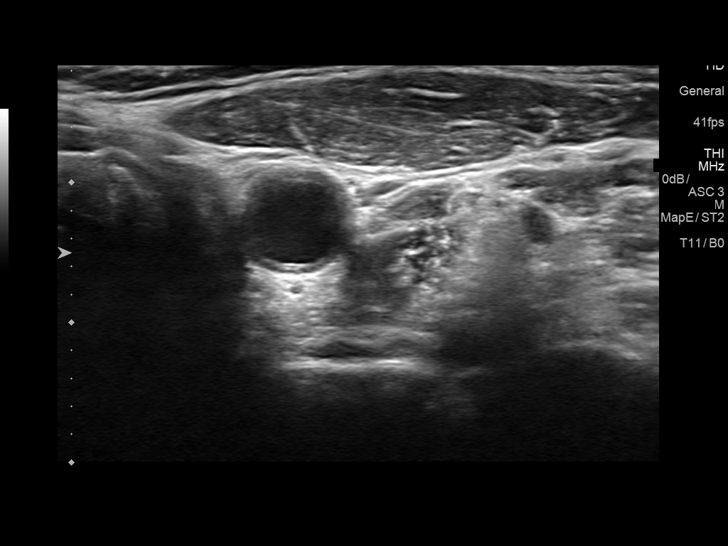
[im 22/38]
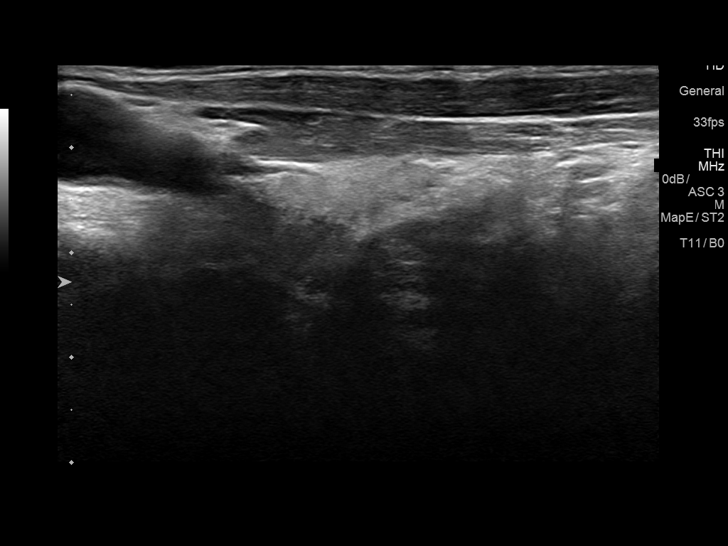
[im 25/38]
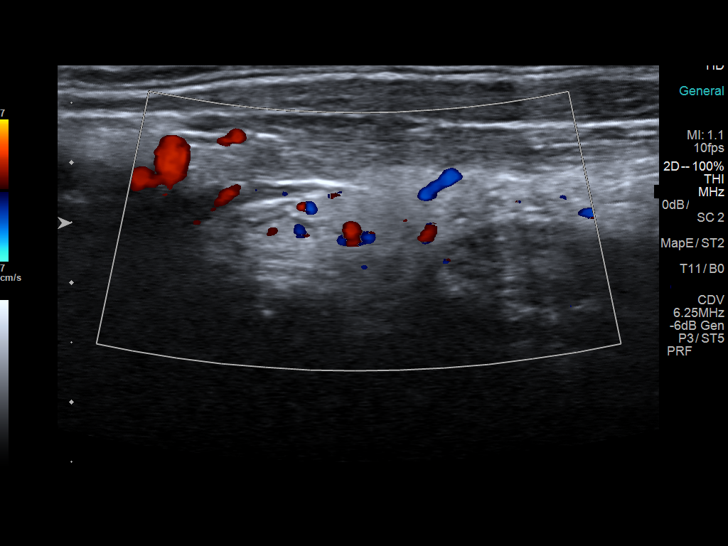
[im 28/38]
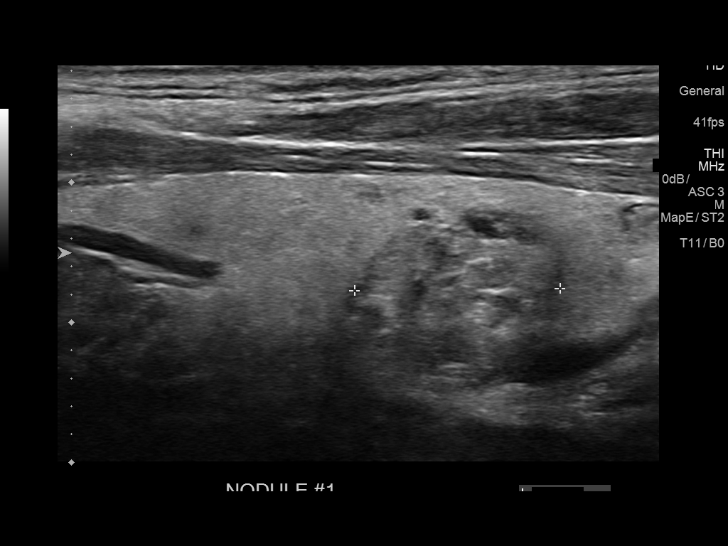
[im 31/38]
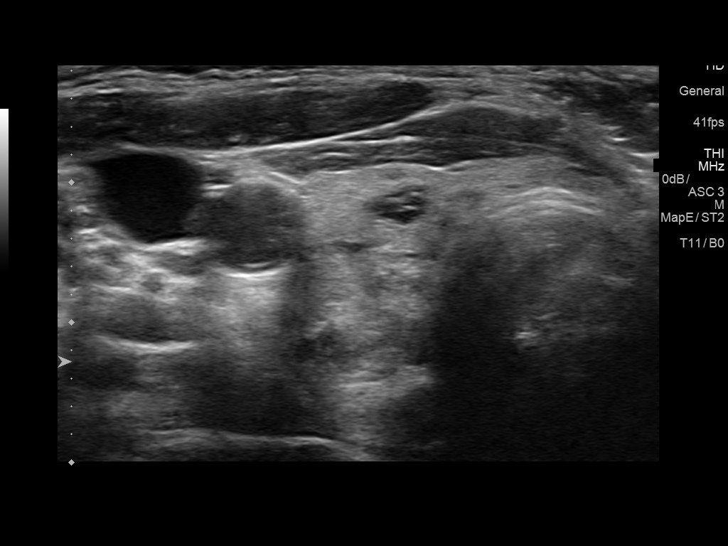
[im 34/38]
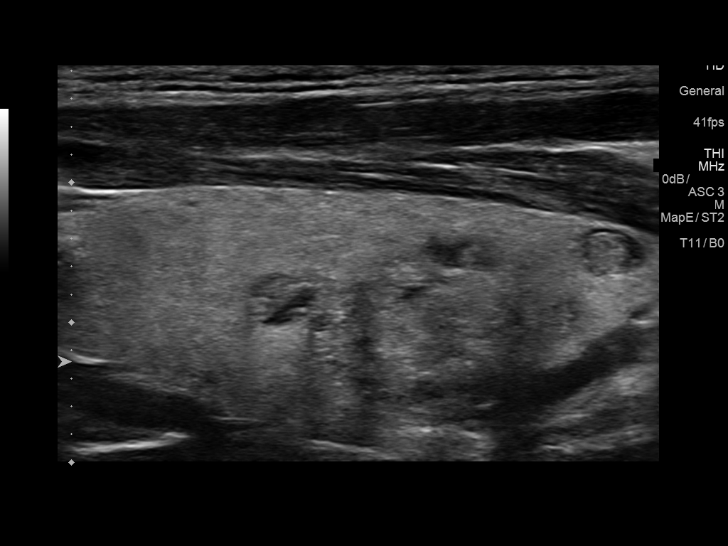
[im 38/38]
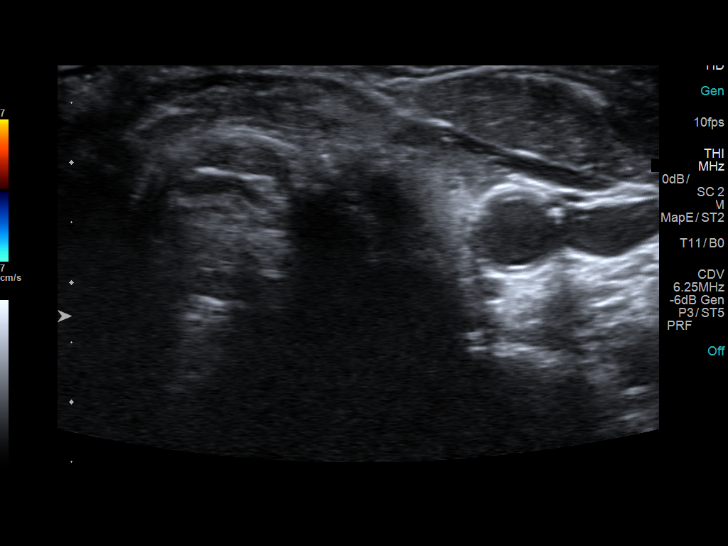

[13 of 25 positions shown; findings below may reference images not displayed]

FINDINGS: Parenchymal Echotexture: Mildly heterogenous

Isthmus: 0.3 cm

Right lobe: 4.5 cm x 1.4 cm x 1.4 cm

Left lobe: Left thyroidectomy

_________________________________________________________

Estimated total number of nodules >/= 1 cm: 1

Number of spongiform nodules >/=  2 cm not described below (TR1): 0

Number of mixed cystic and solid nodules >/= 1.5 cm not described
below (TR2): 0

_________________________________________________________

Nodule # 1:

Location: Right; Mid

Maximum size: 1.5 cm; Other 2 dimensions: 1.3 cm x 1.0 cm

Composition: solid/almost completely solid (2)

Echogenicity: isoechoic (1)

Shape: taller-than-wide (3)

Margins: ill-defined (0)

Echogenic foci: none (0)

ACR TI-RADS total points: 6.

ACR TI-RADS risk category: TR4 (4-6 points).

ACR TI-RADS recommendations:

Nodule has been previously biopsied

_________________________________________________________

No adenopathy
IMPRESSION: Surgical changes of left thyroidectomy.

Redemonstration of right-sided thyroid nodule, smaller than the
comparison study. This nodule has been previously biopsied.
Recommend correlation with prior biopsy result.

## 2019-08-03 DIAGNOSIS — H2513 Age-related nuclear cataract, bilateral: Secondary | ICD-10-CM | POA: Diagnosis not present

## 2019-08-03 DIAGNOSIS — H40023 Open angle with borderline findings, high risk, bilateral: Secondary | ICD-10-CM | POA: Diagnosis not present

## 2019-08-03 DIAGNOSIS — H43813 Vitreous degeneration, bilateral: Secondary | ICD-10-CM | POA: Diagnosis not present

## 2019-09-10 ENCOUNTER — Other Ambulatory Visit: Payer: Self-pay

## 2019-09-10 ENCOUNTER — Ambulatory Visit (INDEPENDENT_AMBULATORY_CARE_PROVIDER_SITE_OTHER): Payer: BC Managed Care – PPO | Admitting: Psychiatry

## 2019-09-10 DIAGNOSIS — F4322 Adjustment disorder with anxiety: Secondary | ICD-10-CM | POA: Diagnosis not present

## 2019-09-10 NOTE — Progress Notes (Signed)
[facility] MD/PA/NP Initial Note  09/11/2019 11:32 AM Becky Anderson  MRN:  [id number]  Chief Complaint:    Concerns about son   HPI: Former pt returning over concerns about son.  Son refused to come to this appt as she hoped.  Concerned son has an alcohol and video game additction problem. Becky Anderson is 64 yo.  Refused to come for Father's Day yesterday.    Becky Anderson had been on at least an ADD med from doctor in [facility] and perhaps other meds.  She wanted to come to talk about this and how to deal with it.  She feels he has a totally different personality than he used to have.  Concerns he struggles with OCD.  Past history of scrupulosity and now avoids spiritual matters.  She and her husband Becky Anderson are struggling with what to do about the situation.  He is living at a family property in [place] now most of the time.    Becky Anderson goes back and forth about how to deal with Becky Anderson.   Visit Diagnosis:    ICD-10-CM   1. Adjustment disorder with anxious mood  F43.22     Past Psychiatric History:  Seen in this practice 25 years ago.  Past Medical History:  Past Medical History:  Diagnosis Date  . Fibroid   . Thyroid disease    thyroidectomy-partial    Past Surgical History:  Procedure Laterality Date  . APPENDECTOMY  02/1999  . BREAST EXCISIONAL BIOPSY Right 1989  . BREAST SURGERY  1989   rt. breast bx--benign  . ENDOMETRIAL BIOPSY  10-08-2009   -benign proliferative endometrium  . THYROIDECTOMY, PARTIAL  1999    Family Psychiatric History: Becky Anderson and aunt history of OCD. Becky Anderson is alcoholic.    Family History:  Family History  Problem Relation Age of Onset  . Hypertension Mother   . Cancer Mother 40       Rectal cancer  . Stroke Mother   . Atrial fibrillation Father   . Hypertension Father   . Other Father        hx ruptured appendix w/sepsis  . Breast cancer Maternal Aunt   . Stroke Paternal Grandfather     Social History:  Social History   Socioeconomic History  . Marital  status: Married    Spouse name: Not on file  . Number of children: Not on file  . Years of education: Not on file  . Highest education level: Not on file  Occupational History  . Not on file  Tobacco Use  . Smoking status: Never Smoker  . Smokeless tobacco: Never Used  Vaping Use  . Vaping Use: Never used  Substance and Sexual Activity  . Alcohol use: No    Alcohol/week: 0.0 standard drinks  . Drug use: No  . Sexual activity: Yes    Partners: Male    Birth control/protection: Post-menopausal  Other Topics Concern  . Not on file  Social History Narrative  . Not on file   Social Determinants of Health   Financial Resource Strain:   . Difficulty of Paying Living Expenses:   Food Insecurity:   . Worried About Charity fundraiser in the Last Year:   . Arboriculturist in the Last Year:   Transportation Needs:   . Film/video editor (Medical):   Marland Kitchen Lack of Transportation (Non-Medical):   Physical Activity:   . Days of Exercise per Week:   . Minutes of Exercise per  Session:   Stress:   . Feeling of Stress :   Social Connections:   . Frequency of Communication with Friends and Family:   . Frequency of Social Gatherings with Friends and Family:   . Attends Religious Services:   . Active Member of Clubs or Organizations:   . Attends Archivist Meetings:   Marland Kitchen Marital Status:     Allergies:  Allergies  Allergen Reactions  . Ceclor [Cefaclor]     Pt. Hasn't had any side effects--but both her mom and son have had severe reactions  . Latex     MOM is allergic to latex--patient has never had issue  . Sulfa Antibiotics     Pt. Has never had reaction--but both mom and son have had severe reactions    Metabolic Disorder Labs: No results found for: HGBA1C, MPG No results found for: PROLACTIN No results found for: CHOL, TRIG, HDL, CHOLHDL, VLDL, LDLCALC No results found for: TSH  Therapeutic Level Labs: No results found for: LITHIUM No results found for:  VALPROATE No components found for:  CBMZ  Current Medications: Current Outpatient Medications  Medication Sig Dispense Refill  . levothyroxine (SYNTHROID, LEVOTHROID) 75 MCG tablet Take 75 mcg by mouth daily before breakfast. Takes 6 days a week     No current facility-administered medications for this visit.    Medication Side Effects: none  Orders placed this visit:  No orders of the defined types were placed in this encounter.   Psychiatric Specialty Exam:  Review of Systems  Constitutional: Negative for activity change, fatigue and unexpected weight change.  HENT: Negative for congestion, ear pain, hearing loss, sinus pressure and tinnitus.   Eyes: Negative for visual disturbance.  Respiratory: Negative for chest tightness and shortness of breath.   Cardiovascular: Negative for chest pain and palpitations.  Gastrointestinal: Negative for abdominal distention, abdominal pain, diarrhea, nausea and vomiting.  Endocrine: Negative for cold intolerance.  Genitourinary: Negative for difficulty urinating, frequency and pelvic pain.  Musculoskeletal: Negative for arthralgias, back pain and neck pain.  Skin: Negative for rash.  Allergic/Immunologic: Negative for environmental allergies.  Neurological: Negative for dizziness, tremors, speech difficulty, weakness and headaches.  Psychiatric/Behavioral: Negative for agitation, behavioral problems, confusion, decreased concentration, dysphoric mood, hallucinations, self-injury, sleep disturbance and suicidal ideas. The patient is not hyperactive.     Last menstrual period 08/21/2010.There is no height or weight on file to calculate BMI.  General Appearance: Casual  Eye Contact:  Good  Speech:  Clear and Coherent and Normal Rate  Volume:  Normal  Mood:  Anxious  Affect:  Congruent and Full Range  Thought Process:  Coherent and Goal Directed  Orientation:  Full (Time, Place, and Person)  Thought Content: Logical   Suicidal Thoughts:   No  Homicidal Thoughts:  No  Memory:  WNL  Judgement:  Good  Insight:  Good  Psychomotor Activity:  Normal  Concentration:  Concentration: Good  Recall:  Good  Fund of Knowledge: Good  Language: Good  Assets:  Communication Skills Desire for Improvement Financial Resources/Insurance Housing Intimacy Leisure Time Physical Health Resilience Social Support Talents/Skills Transportation Vocational/Educational Others:  Strong faith  ADL's:  Intact  Cognition: WNL  Prognosis:  Good   Screenings: MDQ negative  Receiving Psychotherapy: No   Treatment Plan/Recommendations: patient is having some anxiety and stress over dealing with her son's failure to launch as well as his apparent addiction problems and potential anxiety problems.   We discussed ways to approach an individual who  may have an addiction to alcohol.  We discussed techniques around intervention.  We discussed techniques around dealing with boundaries and failure to launch issues.  He has an independent source of income which limits the parents options to some degree.  We discussed ways to give him information about family history of anxiety and family history of addiction that may lower his inhibitions towards seeking treatment.  He had originally agreed to come to an appointment to see me today but then backed out at the last minute.  We discussed strategies to help him be more open to seeking treatment.  The patient does not have any indication to initiate medications.  She has a past history of anxiety which is managed generally except around this issue.  No meds are indicated.  This was a 1 hour appointment with most of it focused on therapy as noted.  Follow-up as needed Purnell Shoemaker, MD

## 2019-09-11 ENCOUNTER — Encounter: Payer: Self-pay | Admitting: Psychiatry

## 2019-11-15 ENCOUNTER — Other Ambulatory Visit: Payer: Self-pay

## 2019-11-15 ENCOUNTER — Ambulatory Visit (INDEPENDENT_AMBULATORY_CARE_PROVIDER_SITE_OTHER): Payer: BC Managed Care – PPO | Admitting: Psychiatry

## 2019-11-15 ENCOUNTER — Encounter: Payer: Self-pay | Admitting: Psychiatry

## 2019-11-15 DIAGNOSIS — F4322 Adjustment disorder with anxiety: Secondary | ICD-10-CM | POA: Diagnosis not present

## 2019-11-15 NOTE — Progress Notes (Signed)
VIOLANDA BOBECK [id number] 1955/08/16 64 y.o.  Subjective:   Patient ID:  Becky Anderson is a 64 y.o. (DOB 1956/01/09) female.  Chief Complaint:  Chief Complaint  Patient presents with  . Follow-up  . Anxiety  . Stress    HPI SHEREE LALLA presents to the office today for follow-up of adjustment disorder with anxiety surrounding issues with her sons failure to launch and apparent addiction problems with anxiety. She was initially seen September 10, 2019 and no meds were prescribed.  She scheduled this appt for son [name] to come again.  This morning he refused to come to the appt again.  Did not give an explanation today but earlier in the week said "maybe I should go to someone you don't know, Mom".  Years ago he saw Fenton Foy as a counselor and said he'd consider it again but hasn't done anything. He will eat dinner with them but then leave ASAP and spends a lot of time by himself. He said he will go stay with his GM to help her with her health but not sure if this will work out.  Asks about why he won't get help and asks about spiritual interventions for him.  [name] doesn't understand why a stuck person won't change.   Used to smile a lot but not anymore.  Huge adjustment problems when started schoold and church he had huge social anxiety.  He used to have a soft heart but doesn't see that anymore.  He's letting M take care of his own sick dog.    Review of Systems:  Review of Systems  Neurological: Negative for tremors and weakness.    Medications: I have reviewed the patient's current medications.  Current Outpatient Medications  Medication Sig Dispense Refill  . acetaminophen (TYLENOL) 500 MG tablet Take 500 mg by mouth every 6 (six) hours as needed.    Marland Kitchen levothyroxine (SYNTHROID, LEVOTHROID) 75 MCG tablet Take 75 mcg by mouth daily before breakfast. Takes 6 days a week     No current facility-administered medications for this visit.    Medication Side Effects: None  Allergies:   Allergies  Allergen Reactions  . Ceclor [Cefaclor]     Pt. Hasn't had any side effects--but both her mom and son have had severe reactions  . Latex     MOM is allergic to latex--patient has never had issue  . Sulfa Antibiotics     Pt. Has never had reaction--but both mom and son have had severe reactions    Past Medical History:  Diagnosis Date  . Fibroid   . Thyroid disease    thyroidectomy-partial    Family History  Problem Relation Age of Onset  . Hypertension Mother   . Cancer Mother 64       Rectal cancer  . Stroke Mother   . Atrial fibrillation Father   . Hypertension Father   . Other Father        hx ruptured appendix w/sepsis  . Breast cancer Maternal Aunt   . Stroke Paternal Grandfather     Social History   Socioeconomic History  . Marital status: Married    Spouse name: Not on file  . Number of children: Not on file  . Years of education: Not on file  . Highest education level: Not on file  Occupational History  . Not on file  Tobacco Use  . Smoking status: Never Smoker  . Smokeless tobacco: Never Used  Vaping Use  .  Vaping Use: Never used  Substance and Sexual Activity  . Alcohol use: No    Alcohol/week: 0.0 standard drinks  . Drug use: No  . Sexual activity: Yes    Partners: Male    Birth control/protection: Post-menopausal  Other Topics Concern  . Not on file  Social History Narrative  . Not on file   Social Determinants of Health   Financial Resource Strain:   . Difficulty of Paying Living Expenses: Not on file  Food Insecurity:   . Worried About Charity fundraiser in the Last Year: Not on file  . Ran Out of Food in the Last Year: Not on file  Transportation Needs:   . Lack of Transportation (Medical): Not on file  . Lack of Transportation (Non-Medical): Not on file  Physical Activity:   . Days of Exercise per Week: Not on file  . Minutes of Exercise per Session: Not on file  Stress:   . Feeling of Stress : Not on file  Social  Connections:   . Frequency of Communication with Friends and Family: Not on file  . Frequency of Social Gatherings with Friends and Family: Not on file  . Attends Religious Services: Not on file  . Active Member of Clubs or Organizations: Not on file  . Attends Archivist Meetings: Not on file  . Marital Status: Not on file  Intimate Partner Violence:   . Fear of Current or Ex-Partner: Not on file  . Emotionally Abused: Not on file  . Physically Abused: Not on file  . Sexually Abused: Not on file    Past Medical History, Surgical history, Social history, and Family history were reviewed and updated as appropriate.   Please see review of systems for further details on the patient's review from today.   Objective:   Physical Exam:  LMP 08/21/2010 (Exact Date)   Physical Exam Constitutional:      General: She is not in acute distress. Musculoskeletal:        General: No deformity.  Neurological:     Mental Status: She is alert and oriented to person, place, and time.     Coordination: Coordination normal.  Psychiatric:        Attention and Perception: Attention and perception normal. She does not perceive auditory or visual hallucinations.        Mood and Affect: Mood is anxious. Mood is not depressed. Affect is not labile, blunt, angry or inappropriate.        Speech: Speech normal.        Behavior: Behavior normal.        Thought Content: Thought content normal. Thought content is not paranoid or delusional. Thought content does not include homicidal or suicidal ideation. Thought content does not include homicidal or suicidal plan.        Cognition and Memory: Cognition and memory normal.        Judgment: Judgment normal.     Comments: Insight intact Stressed dealing with son     Lab Review:  No results found for: NA, K, CL, CO2, GLUCOSE, BUN, CREATININE, CALCIUM, PROT, ALBUMIN, AST, ALT, ALKPHOS, BILITOT, GFRNONAA, GFRAA  No results found for: WBC, RBC, HGB,  HCT, PLT, MCV, MCH, MCHC, RDW, LYMPHSABS, MONOABS, EOSABS, BASOSABS  No results found for: POCLITH, LITHIUM   No results found for: PHENYTOIN, PHENOBARB, VALPROATE, CBMZ   .res Assessment: Plan:    Kaylynn was seen today for follow-up, anxiety and stress.  Diagnoses and all orders  for this visit:  Adjustment disorder with anxious mood  Again as in the last session extensive discussion around ways to manage her sons psychiatric problems ways to respond.  Discussed broad categories of care at versus stick approach and specific examples of each type of approach and ways to implement it.  Offered option of continuing to see him.  Discussed spiritual as well as psychological concerns about his health.  Follow-up as needed  60-minute session.  Lynder Parents MD, DFAPA  Please see After Visit Summary for patient specific instructions.  Future Appointments  Date Time Provider Paulina  02/06/2020  9:30 AM Nunzio Cobbs, MD [place] None    No orders of the defined types were placed in this encounter.   -------------------------------

## 2020-01-24 DIAGNOSIS — Z0000 Encounter for general adult medical examination without abnormal findings: Secondary | ICD-10-CM | POA: Diagnosis not present

## 2020-01-24 DIAGNOSIS — E039 Hypothyroidism, unspecified: Secondary | ICD-10-CM | POA: Diagnosis not present

## 2020-01-29 DIAGNOSIS — Z1389 Encounter for screening for other disorder: Secondary | ICD-10-CM | POA: Diagnosis not present

## 2020-01-29 DIAGNOSIS — Z0000 Encounter for general adult medical examination without abnormal findings: Secondary | ICD-10-CM | POA: Diagnosis not present

## 2020-01-29 DIAGNOSIS — Z1331 Encounter for screening for depression: Secondary | ICD-10-CM | POA: Diagnosis not present

## 2020-01-29 DIAGNOSIS — E041 Nontoxic single thyroid nodule: Secondary | ICD-10-CM | POA: Diagnosis not present

## 2020-02-06 ENCOUNTER — Ambulatory Visit: Payer: Self-pay | Admitting: Obstetrics and Gynecology

## 2020-02-25 DIAGNOSIS — Z1212 Encounter for screening for malignant neoplasm of rectum: Secondary | ICD-10-CM | POA: Diagnosis not present

## 2020-03-22 DIAGNOSIS — U071 COVID-19: Secondary | ICD-10-CM

## 2020-03-22 HISTORY — DX: COVID-19: U07.1

## 2020-04-25 DIAGNOSIS — U071 COVID-19: Secondary | ICD-10-CM | POA: Diagnosis not present

## 2020-04-25 DIAGNOSIS — R509 Fever, unspecified: Secondary | ICD-10-CM | POA: Diagnosis not present

## 2020-04-30 DIAGNOSIS — J189 Pneumonia, unspecified organism: Secondary | ICD-10-CM | POA: Diagnosis not present

## 2020-05-12 DIAGNOSIS — J189 Pneumonia, unspecified organism: Secondary | ICD-10-CM | POA: Diagnosis not present

## 2020-07-02 ENCOUNTER — Ambulatory Visit: Payer: Self-pay | Admitting: Obstetrics and Gynecology

## 2020-09-25 NOTE — Progress Notes (Signed)
65 y.o. G56P2002 Married Caucasian female here for annual exam.    Had Covid in January and developed pneumonia.   No change in bladder function.  Still with leakage of urine.  Occasionally has fecal leakage. Wears a thin pad.  Takes 21 capsules of Metamucil daily.  Dog died.   Patient's last menstrual period was 08/21/2010 (exact date).           Sexually active: No.  The current method of family planning is none.    Exercising: Yes.    Home exercise. Smoker:  no  Health Maintenance: Pap:  05-07-16 normal neg HPV History of abnormal Pap:  no MMG:  05-02-17 possible asymmetry right breast--05-03-17 Diag.Mammo right breast normal Colonoscopy:  12-2012 normal BMD:   never  TDaP:  05-07-16 Gardasil:   no [id number] Hep C:never Screening Labs:  Hb today: PCP, Urine today: PCP   reports that she has never smoked. She has never used smokeless tobacco. She reports that she does not drink alcohol and does not use drugs.  Past Medical History:  Diagnosis Date   COVID 03/2020   Fibroid    Thyroid disease    thyroidectomy-partial    Past Surgical History:  Procedure Laterality Date   APPENDECTOMY  02/1999   BREAST EXCISIONAL BIOPSY Right 1989   BREAST SURGERY  1989   rt. breast bx--benign   ENDOMETRIAL BIOPSY  10-08-2009   -benign proliferative endometrium   THYROIDECTOMY, PARTIAL  1999    Current Outpatient Medications  Medication Sig Dispense Refill   acetaminophen (TYLENOL) 500 MG tablet Take 500 mg by mouth every 6 (six) hours as needed.     levothyroxine (SYNTHROID, LEVOTHROID) 75 MCG tablet Take 75 mcg by mouth daily before breakfast. Takes 6 days a week     No current facility-administered medications for this visit.    Family History  Problem Relation Age of Onset   Hypertension Mother    Cancer Mother 48       Rectal cancer   Stroke Mother    Atrial fibrillation Father    Hypertension Father    Other Father        hx ruptured appendix w/sepsis   Breast  cancer Maternal Aunt    Stroke Paternal Grandfather     Review of Systems  All other systems reviewed and are negative.  Exam:   BP 118/66 (BP Location: Right Arm, Patient Position: Sitting, Cuff Size: Normal)   Pulse 80   Ht 5\' 4"  (1.626 m)   Wt 122 lb (55.3 kg)   LMP 08/21/2010 (Exact Date)   SpO2 98%   BMI 20.94 kg/m     General appearance: alert, cooperative and appears stated age Head: normocephalic, without obvious abnormality, atraumatic Neck: no adenopathy, supple, symmetrical, trachea midline and thyroid normal to inspection and palpation Lungs: clear to auscultation bilaterally Breasts: normal appearance, no masses or tenderness, No nipple retraction or dimpling, No nipple discharge or bleeding, No axillary adenopathy Heart: regular rate and rhythm Abdomen: soft, non-tender; no masses, no organomegaly Extremities: extremities normal, atraumatic, no cyanosis or edema Skin: skin color, texture, turgor normal. No rashes or lesions Lymph nodes: cervical, supraclavicular, and axillary nodes normal. Neurologic: grossly normal  Pelvic: External genitalia:  no lesions              No abnormal inguinal nodes palpated.              Urethra:  normal appearing urethra with no masses, tenderness or lesions  Bartholins and Skenes: normal                 Vagina: normal appearing vagina with normal color and discharge, no lesions              Cervix: no lesions              Pap taken: no Bimanual Exam:  Uterus:  normal size, contour, position, consistency, mobility, non-tender              Adnexa: no mass, fullness, tenderness              Rectal exam: yes.  Confirms.              Anus:  normal sphincter tone, no lesions  Chaperone was present for exam.  Assessment:   Well woman visit with normal exam. Dense breast tissue, cat D.  Incomplete uterovaginal prolapse.  Anatomy looks and palpates normal today. Mild urinary incontinence.  Fecal soiling - controlling  with Metamucil. FH of rectal cancer. Status post partial thyroidectomy.   Hx of gestational diabetes.    Plan: Mammogram screening discussed.  She will schedule.  Self breast awareness reviewed. Pap and HR HPV next year. Guidelines for Calcium, Vitamin D, regular exercise program including cardiovascular and weight bearing exercise. Kegel's recommended.  BMD at age 56. Follow up annually and prn.    After visit summary provided.

## 2020-10-02 ENCOUNTER — Encounter: Payer: Self-pay | Admitting: Obstetrics and Gynecology

## 2020-10-02 ENCOUNTER — Ambulatory Visit (INDEPENDENT_AMBULATORY_CARE_PROVIDER_SITE_OTHER): Payer: BC Managed Care – PPO | Admitting: Obstetrics and Gynecology

## 2020-10-02 ENCOUNTER — Other Ambulatory Visit: Payer: Self-pay

## 2020-10-02 VITALS — BP 118/66 | HR 80 | Ht 64.0 in | Wt 122.0 lb

## 2020-10-02 DIAGNOSIS — Z01419 Encounter for gynecological examination (general) (routine) without abnormal findings: Secondary | ICD-10-CM | POA: Diagnosis not present

## 2020-10-02 NOTE — Patient Instructions (Signed)

## 2020-10-17 DIAGNOSIS — H40023 Open angle with borderline findings, high risk, bilateral: Secondary | ICD-10-CM | POA: Diagnosis not present

## 2020-10-17 DIAGNOSIS — H2513 Age-related nuclear cataract, bilateral: Secondary | ICD-10-CM | POA: Diagnosis not present

## 2020-10-17 DIAGNOSIS — H43813 Vitreous degeneration, bilateral: Secondary | ICD-10-CM | POA: Diagnosis not present

## 2021-03-12 DIAGNOSIS — J209 Acute bronchitis, unspecified: Secondary | ICD-10-CM | POA: Diagnosis not present

## 2021-03-12 DIAGNOSIS — J111 Influenza due to unidentified influenza virus with other respiratory manifestations: Secondary | ICD-10-CM | POA: Diagnosis not present

## 2021-07-02 DIAGNOSIS — E039 Hypothyroidism, unspecified: Secondary | ICD-10-CM | POA: Diagnosis not present

## 2021-07-03 DIAGNOSIS — R7989 Other specified abnormal findings of blood chemistry: Secondary | ICD-10-CM | POA: Diagnosis not present

## 2021-07-03 DIAGNOSIS — E039 Hypothyroidism, unspecified: Secondary | ICD-10-CM | POA: Diagnosis not present

## 2021-07-10 DIAGNOSIS — Z1212 Encounter for screening for malignant neoplasm of rectum: Secondary | ICD-10-CM | POA: Diagnosis not present

## 2021-07-10 DIAGNOSIS — U071 COVID-19: Secondary | ICD-10-CM | POA: Diagnosis not present

## 2021-07-10 DIAGNOSIS — Z1331 Encounter for screening for depression: Secondary | ICD-10-CM | POA: Diagnosis not present

## 2021-07-10 DIAGNOSIS — Z1339 Encounter for screening examination for other mental health and behavioral disorders: Secondary | ICD-10-CM | POA: Diagnosis not present

## 2021-07-10 DIAGNOSIS — Z0000 Encounter for general adult medical examination without abnormal findings: Secondary | ICD-10-CM | POA: Diagnosis not present

## 2021-10-01 NOTE — Progress Notes (Signed)
66 y.o. G65P2002 Married Caucasian female here for annual exam.    Had Covid pneumonia last year.   Has been doing Kegel's.  Some mild urinary incontinence like with heavy lifting.  Wears a pad.  Some mild accidental leakage of stool after a BM.  Using Metamucil some (14 capsules per day) and Fibercon mixed with juice.  Is hydrating well.   Did a stool sample test for colon cancer screening at Dr. Baldwin Crown office.   PCP: Reynold Bowen, MD    Patient's last menstrual period was 08/21/2010 (exact date).           Sexually active: No. No penetration The current method of family planning is Postmenopausal    Exercising: Yes.     Stretches and walking Smoker:  no  Health Maintenance: Pap:  05-07-16 Neg:Neg HR HPV, 03-28-13 Neg:Neg HR HPV History of abnormal Pap:  no MMG:   05-02-17 possible asymmetry right breast--05-03-17 Diag.Mammo right breast normal. Pt. Knows to schedule Colonoscopy:   12-2012 normal BMD:   n/a  Result  n/a TDaP:  05-07-16 Gardasil:   n/a HIV: never Hep C: never Screening Labs:  PCP   reports that she has never smoked. She has never used smokeless tobacco. She reports that she does not drink alcohol and does not use drugs.  Past Medical History:  Diagnosis Date   COVID 03/2020   Fibroid    Thyroid disease    thyroidectomy-partial    Past Surgical History:  Procedure Laterality Date   APPENDECTOMY  02/1999   BREAST EXCISIONAL BIOPSY Right 1989   BREAST SURGERY  1989   rt. breast bx--benign   ENDOMETRIAL BIOPSY  10-08-2009   -benign proliferative endometrium   THYROIDECTOMY, PARTIAL  1999    Current Outpatient Medications  Medication Sig Dispense Refill   acetaminophen (TYLENOL) 500 MG tablet Take 500 mg by mouth every 6 (six) hours as needed.     levothyroxine (SYNTHROID, LEVOTHROID) 75 MCG tablet Take 75 mcg by mouth daily before breakfast. Takes 6 days a week     No current facility-administered medications for this visit.    Family History   Problem Relation Age of Onset   Hypertension Mother    Cancer Mother 32       Rectal cancer   Stroke Mother    Atrial fibrillation Father    Hypertension Father    Other Father        hx ruptured appendix w/sepsis   Breast cancer Maternal Aunt    Stroke Paternal Grandfather     Review of Systems  All other systems reviewed and are negative.   Exam:   BP 128/70   Pulse 91   Ht '5\' 4"'$  (1.626 m)   Wt 125 lb (56.7 kg)   LMP 08/21/2010 (Exact Date)   SpO2 98%   BMI 21.46 kg/m     General appearance: alert, cooperative and appears stated age Head: normocephalic, without obvious abnormality, atraumatic Neck: no adenopathy, supple, symmetrical, trachea midline and thyroid normal to inspection and palpation Lungs: clear to auscultation bilaterally Breasts: normal appearance, no masses or tenderness, No nipple retraction or dimpling, No nipple discharge or bleeding, No axillary adenopathy Heart: regular rate and rhythm Abdomen: soft, non-tender; no masses, no organomegaly Extremities: extremities normal, atraumatic, no cyanosis or edema Skin: skin color, texture, turgor normal. No rashes or lesions Lymph nodes: cervical, supraclavicular, and axillary nodes normal. Neurologic: grossly normal  Pelvic: External genitalia:  no lesions  No abnormal inguinal nodes palpated.              Urethra:  normal appearing urethra with no masses, tenderness or lesions              Bartholins and Skenes: normal                 Vagina: normal appearing vagina with normal color and discharge, no lesions              Cervix: no lesions              Pap taken: yes Bimanual Exam:  Uterus:  normal size, contour, position, consistency, mobility, non-tender              Adnexa: no mass, fullness, tenderness              Rectal exam: yes.  Confirms.              Anus:  normal sphincter tone, no lesions  Chaperone was present for exam:  Estill Bamberg, CMA  Assessment:   Well woman visit with  gynecologic exam. Dense breast tissue, cat D.  Hx incomplete uterovaginal prolapse.  Anatomy looks and palpates normal today. Mild urinary incontinence.  Fecal soiling - controlling with Metamucil and Fibercon.  FH of rectal cancer. Status post partial thyroidectomy.   Hx of gestational diabetes.    Plan: Mammogram screening discussed.  She will consider.  She understands this is the recommended screening tool to detect early breast cancer.  Self breast awareness reviewed. Pap and HR HPV collected. Guidelines for Calcium, Vitamin D, regular exercise program including cardiovascular and weight bearing exercise. She will consider bone density.  Labs with PCP.  Follow up annually and prn.   After visit summary provided.

## 2021-10-07 ENCOUNTER — Ambulatory Visit (INDEPENDENT_AMBULATORY_CARE_PROVIDER_SITE_OTHER): Payer: BC Managed Care – PPO | Admitting: Obstetrics and Gynecology

## 2021-10-07 ENCOUNTER — Other Ambulatory Visit (HOSPITAL_COMMUNITY)
Admission: RE | Admit: 2021-10-07 | Discharge: 2021-10-07 | Disposition: A | Discharge status: Home / Self Care | Payer: BC Managed Care – PPO | Source: Ambulatory Visit | Attending: Obstetrics and Gynecology | Admitting: Obstetrics and Gynecology

## 2021-10-07 ENCOUNTER — Encounter: Payer: Self-pay | Admitting: Obstetrics and Gynecology

## 2021-10-07 VITALS — BP 128/70 | HR 91 | Ht 64.0 in | Wt 125.0 lb

## 2021-10-07 DIAGNOSIS — Z01419 Encounter for gynecological examination (general) (routine) without abnormal findings: Secondary | ICD-10-CM | POA: Diagnosis not present

## 2021-10-07 DIAGNOSIS — Z124 Encounter for screening for malignant neoplasm of cervix: Secondary | ICD-10-CM | POA: Diagnosis not present

## 2021-10-07 NOTE — Patient Instructions (Signed)

## 2021-10-12 LAB — CYTOLOGY - PAP
Comment: NEGATIVE
Diagnosis: NEGATIVE
High risk HPV: NEGATIVE

## 2021-10-23 DIAGNOSIS — H5203 Hypermetropia, bilateral: Secondary | ICD-10-CM | POA: Diagnosis not present

## 2021-10-23 DIAGNOSIS — H43813 Vitreous degeneration, bilateral: Secondary | ICD-10-CM | POA: Diagnosis not present

## 2021-10-23 DIAGNOSIS — H2513 Age-related nuclear cataract, bilateral: Secondary | ICD-10-CM | POA: Diagnosis not present

## 2022-05-11 DIAGNOSIS — H43813 Vitreous degeneration, bilateral: Secondary | ICD-10-CM | POA: Diagnosis not present

## 2022-05-11 DIAGNOSIS — H2513 Age-related nuclear cataract, bilateral: Secondary | ICD-10-CM | POA: Diagnosis not present

## 2022-07-14 DIAGNOSIS — Z1212 Encounter for screening for malignant neoplasm of rectum: Secondary | ICD-10-CM | POA: Diagnosis not present

## 2022-07-14 DIAGNOSIS — R7989 Other specified abnormal findings of blood chemistry: Secondary | ICD-10-CM | POA: Diagnosis not present

## 2022-07-14 DIAGNOSIS — E039 Hypothyroidism, unspecified: Secondary | ICD-10-CM | POA: Diagnosis not present

## 2022-07-20 DIAGNOSIS — Z8632 Personal history of gestational diabetes: Secondary | ICD-10-CM | POA: Diagnosis not present

## 2022-07-20 DIAGNOSIS — Z0000 Encounter for general adult medical examination without abnormal findings: Secondary | ICD-10-CM | POA: Diagnosis not present

## 2022-07-20 DIAGNOSIS — N6019 Diffuse cystic mastopathy of unspecified breast: Secondary | ICD-10-CM | POA: Diagnosis not present

## 2022-07-20 DIAGNOSIS — E041 Nontoxic single thyroid nodule: Secondary | ICD-10-CM | POA: Diagnosis not present

## 2022-07-20 DIAGNOSIS — R82998 Other abnormal findings in urine: Secondary | ICD-10-CM | POA: Diagnosis not present

## 2022-07-20 DIAGNOSIS — E039 Hypothyroidism, unspecified: Secondary | ICD-10-CM | POA: Diagnosis not present

## 2022-10-12 NOTE — Progress Notes (Deleted)
67 y.o. G34P2002 Married Caucasian female here for annual exam.    PCP:     Patient's last menstrual period was 08/21/2010 (exact date).           Sexually active: {yes no:314532}  The current method of family planning is post menopausal status.    Exercising: {yes no:314532}  {types:19826} Smoker:  no  Health Maintenance: Pap:    05-07-16 Neg:Neg HR HPV, 03-28-13 Neg:Neg HR HPV  History of abnormal Pap:  no MMG:  05/03/17 Breast density Cat D, BI-RADS CAT 1 neg Colonoscopy:  12/2012 normal BMD:   n/a  Result  n/a TDaP:  05/07/16 Gardasil:   no HIV: n/a Hep C: n/a Screening Labs:  Hb today: ***, Urine today: ***   reports that she has never smoked. She has never used smokeless tobacco. She reports that she does not drink alcohol and does not use drugs.  Past Medical History:  Diagnosis Date   COVID 03/2020   Fibroid    Thyroid disease    thyroidectomy-partial    Past Surgical History:  Procedure Laterality Date   APPENDECTOMY  02/1999   BREAST EXCISIONAL BIOPSY Right 1989   BREAST SURGERY  1989   rt. breast bx--benign   ENDOMETRIAL BIOPSY  10-08-2009   -benign proliferative endometrium   THYROIDECTOMY, PARTIAL  1999    Current Outpatient Medications  Medication Sig Dispense Refill   acetaminophen (TYLENOL) 500 MG tablet Take 500 mg by mouth every 6 (six) hours as needed.     levothyroxine (SYNTHROID, LEVOTHROID) 75 MCG tablet Take 75 mcg by mouth daily before breakfast. Takes 6 days a week     No current facility-administered medications for this visit.    Family History  Problem Relation Age of Onset   Hypertension Mother    Cancer Mother 16       Rectal cancer   Stroke Mother    Atrial fibrillation Father    Hypertension Father    Other Father        hx ruptured appendix w/sepsis   Breast cancer Maternal Aunt    Stroke Paternal Grandfather     Review of Systems  Exam:   LMP 08/21/2010 (Exact Date)     General appearance: alert, cooperative and appears  stated age Head: normocephalic, without obvious abnormality, atraumatic Neck: no adenopathy, supple, symmetrical, trachea midline and thyroid normal to inspection and palpation Lungs: clear to auscultation bilaterally Breasts: normal appearance, no masses or tenderness, No nipple retraction or dimpling, No nipple discharge or bleeding, No axillary adenopathy Heart: regular rate and rhythm Abdomen: soft, non-tender; no masses, no organomegaly Extremities: extremities normal, atraumatic, no cyanosis or edema Skin: skin color, texture, turgor normal. No rashes or lesions Lymph nodes: cervical, supraclavicular, and axillary nodes normal. Neurologic: grossly normal  Pelvic: External genitalia:  no lesions              No abnormal inguinal nodes palpated.              Urethra:  normal appearing urethra with no masses, tenderness or lesions              Bartholins and Skenes: normal                 Vagina: normal appearing vagina with normal color and discharge, no lesions              Cervix: no lesions              Pap  taken: {yes no:314532} Bimanual Exam:  Uterus:  normal size, contour, position, consistency, mobility, non-tender              Adnexa: no mass, fullness, tenderness              Rectal exam: {yes no:314532}.  Confirms.              Anus:  normal sphincter tone, no lesions  Chaperone was present for exam:  ***  Assessment:   Well woman visit with gynecologic exam.   Plan: Mammogram screening discussed. Self breast awareness reviewed. Pap and HR HPV as above. Guidelines for Calcium, Vitamin D, regular exercise program including cardiovascular and weight bearing exercise.   Follow up annually and prn.   Additional counseling given.  {yes [id number]. _______ minutes face to face time of which over 50% was spent in counseling.    After visit summary provided.

## 2022-10-13 ENCOUNTER — Ambulatory Visit: Payer: BC Managed Care – PPO | Admitting: Obstetrics and Gynecology

## 2022-10-26 ENCOUNTER — Ambulatory Visit: Payer: BC Managed Care – PPO | Admitting: Obstetrics and Gynecology

## 2023-01-11 NOTE — Progress Notes (Signed)
67 y.o. G50P2002 Married Caucasian female here for breast and pelvic exam.   No vaginal bleeding.   Mild urinary incontinence.  Bowel control is improved.   Declines mammogram.  Mother is 38 and still working.  Her father passed in 2019.   PCP: Adrian Prince, MD   Patient's last menstrual period was 08/21/2010 (exact date).           Sexually active: No.  The current method of family planning is post menopausal status.    Exercising: Yes.     Walking, stretching Smoker:  no  OB History  Gravida Para Term Preterm AB Living  2 2 2     2   SAB IAB Ectopic Multiple Live Births               # Outcome Date GA Lbr Len/2nd Weight Sex Type Anes PTL Lv  2 Term           1 Term              Health Maintenance: Pap: 10/07/21 neg: HR HPV neg,  05-07-16 Neg:Neg HR HPV, 03-28-13 Neg:Neg HR HPV  History of abnormal Pap:  no MMG: 05/03/17 BI-RADS CAT 1 neg Colonoscopy:  done 10 years ago per patient.  She does an IFOB with her PCP yearly.      BMD:  n/a  Result  n/a  HIV: 05/07/16 Hep C: n/a  Immunization History  Administered Date(s) Administered   Td 04/21/2006   Tdap 05/07/2016      reports that she has never smoked. She has never used smokeless tobacco. She reports that she does not drink alcohol and does not use drugs.  Past Medical History:  Diagnosis Date   COVID 03/2020   Fibroid    Thyroid disease    thyroidectomy-partial    Past Surgical History:  Procedure Laterality Date   APPENDECTOMY  02/1999   BREAST EXCISIONAL BIOPSY Right 1989   BREAST SURGERY  1989   rt. breast bx--benign   ENDOMETRIAL BIOPSY  10-08-2009   -benign proliferative endometrium   THYROIDECTOMY, PARTIAL  1999    Current Outpatient Medications  Medication Sig Dispense Refill   acetaminophen (TYLENOL) 500 MG tablet Take 500 mg by mouth every 6 (six) hours as needed.     levothyroxine (SYNTHROID, LEVOTHROID) 75 MCG tablet Take 75 mcg by mouth daily before breakfast. Takes 6 days a week      No current facility-administered medications for this visit.    Family History  Problem Relation Age of Onset   Hypertension Mother    Cancer Mother 34       Rectal cancer   Stroke Mother    Atrial fibrillation Father    Hypertension Father    Other Father        hx ruptured appendix w/sepsis   Breast cancer Maternal Aunt    Stroke Paternal Grandfather     Review of Systems  All other systems reviewed and are negative.   Exam:   BP 126/72 (BP Location: Left Arm, Patient Position: Sitting, Cuff Size: Normal)   Pulse 92   Ht 5\' 4"  (1.626 m)   Wt 123 lb (55.8 kg)   LMP 08/21/2010 (Exact Date)   SpO2 99%   BMI 21.11 kg/m     General appearance: alert, cooperative and appears stated age Head: normocephalic, without obvious abnormality, atraumatic Neck: no adenopathy, supple, symmetrical, trachea midline and thyroid normal to inspection and palpation Lungs: clear to auscultation bilaterally  Breasts: normal appearance, no masses or tenderness, No nipple retraction or dimpling, No nipple discharge or bleeding, No axillary adenopathy Heart: regular rate and rhythm Abdomen: soft, non-tender; no masses, no organomegaly Extremities: extremities normal, atraumatic, no cyanosis or edema Skin: skin color, texture, turgor normal. No rashes or lesions Lymph nodes: cervical, supraclavicular, and axillary nodes normal. Neurologic: grossly normal  Pelvic: External genitalia:  no lesions              No abnormal inguinal nodes palpated.              Urethra:  normal appearing urethra with no masses, tenderness or lesions              Bartholins and Skenes: normal                 Vagina: normal appearing vagina with normal color and discharge, no lesions              Cervix: no lesions              Pap taken: no Bimanual Exam:  Uterus:  normal size, contour, position, consistency, mobility, non-tender              Adnexa: no mass, fullness, tenderness              Rectal exam: yes.   Confirms.              Anus:  normal sphincter tone, no lesions  Chaperone was present for exam:  Warren Lacy, CMA   Assessment:  Encounter for breast and pelvic exam.  Health education encounter. Dense breast tissue, cat D.  Hx incomplete uterovaginal prolapse.  Anatomy looks and palpates normal today. Mild urinary incontinence.  Hx accidental leakage of stool.  FH of rectal cancer. Status post partial thyroidectomy.   Hx of gestational diabetes.    Plan: Pap and HR HPV testing in 2028.  Mammogram screening discussed. Self breast awareness reviewed. Guidelines for Calcium, Vitamin D, regular exercise program including cardiovascular and weight bearing exercise. We discussed Cologuard and bone density testing.  She will let me or her PCP know if she wishes to do this.  She does labs and vaccine updates with her PCP.  She prefers to do a yearly visit with me.    20 min  total time was spent for this patient encounter, including preparation, face-to-face counseling with the patient, coordination of care, and documentation of the encounter in addition to doing the breast and pelvic exam.

## 2023-01-25 ENCOUNTER — Ambulatory Visit (INDEPENDENT_AMBULATORY_CARE_PROVIDER_SITE_OTHER): Payer: Medicare Other | Admitting: Obstetrics and Gynecology

## 2023-01-25 ENCOUNTER — Encounter: Payer: Self-pay | Admitting: Obstetrics and Gynecology

## 2023-01-25 VITALS — BP 126/72 | HR 92 | Ht 64.0 in | Wt 123.0 lb

## 2023-01-25 DIAGNOSIS — N39498 Other specified urinary incontinence: Secondary | ICD-10-CM

## 2023-01-25 DIAGNOSIS — N814 Uterovaginal prolapse, unspecified: Secondary | ICD-10-CM

## 2023-01-25 DIAGNOSIS — Z01419 Encounter for gynecological examination (general) (routine) without abnormal findings: Secondary | ICD-10-CM

## 2023-01-25 DIAGNOSIS — Z719 Counseling, unspecified: Secondary | ICD-10-CM

## 2023-01-25 NOTE — Patient Instructions (Addendum)

## 2024-01-30 ENCOUNTER — Encounter: Payer: Medicare Other | Admitting: Obstetrics and Gynecology
